# Patient Record
Sex: Male | Born: 2000 | Race: White | Hispanic: No | Marital: Single | Smoking: Never smoker
Health system: Southern US, Community
[De-identification: ages and names within clinical notes are randomized; demographics above are authoritative.]

## PROBLEM LIST (undated history)

## (undated) DIAGNOSIS — F919 Conduct disorder, unspecified: Secondary | ICD-10-CM

## (undated) DIAGNOSIS — F319 Bipolar disorder, unspecified: Secondary | ICD-10-CM

## (undated) DIAGNOSIS — F840 Autistic disorder: Secondary | ICD-10-CM

---
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
## 2021-02-06 ENCOUNTER — Emergency Department (HOSPITAL_COMMUNITY)
Admission: EM | Admit: 2021-02-06 | Discharge: 2021-03-13 | Disposition: A | Discharge status: Home / Self Care | Payer: Medicaid Other | Attending: Emergency Medicine | Admitting: Emergency Medicine

## 2021-02-06 ENCOUNTER — Other Ambulatory Visit: Payer: Self-pay

## 2021-02-06 DIAGNOSIS — F329 Major depressive disorder, single episode, unspecified: Secondary | ICD-10-CM | POA: Insufficient documentation

## 2021-02-06 DIAGNOSIS — F919 Conduct disorder, unspecified: Secondary | ICD-10-CM | POA: Diagnosis present

## 2021-02-06 DIAGNOSIS — R45851 Suicidal ideations: Secondary | ICD-10-CM

## 2021-02-06 DIAGNOSIS — F840 Autistic disorder: Secondary | ICD-10-CM | POA: Diagnosis not present

## 2021-02-06 DIAGNOSIS — R000 Tachycardia, unspecified: Secondary | ICD-10-CM | POA: Diagnosis not present

## 2021-02-06 DIAGNOSIS — F319 Bipolar disorder, unspecified: Secondary | ICD-10-CM | POA: Diagnosis present

## 2021-02-06 DIAGNOSIS — F3481 Disruptive mood dysregulation disorder: Secondary | ICD-10-CM

## 2021-02-06 DIAGNOSIS — Z20822 Contact with and (suspected) exposure to covid-19: Secondary | ICD-10-CM | POA: Diagnosis not present

## 2021-02-06 DIAGNOSIS — R4689 Other symptoms and signs involving appearance and behavior: Secondary | ICD-10-CM | POA: Diagnosis not present

## 2021-02-06 DIAGNOSIS — F918 Other conduct disorders: Secondary | ICD-10-CM | POA: Diagnosis present

## 2021-02-06 LAB — RESP PANEL BY RT-PCR (RSV, FLU A&B, COVID)  RVPGX2
Influenza A by PCR: NEGATIVE
Influenza B by PCR: NEGATIVE
Resp Syncytial Virus by PCR: NEGATIVE
SARS Coronavirus 2 by RT PCR: NEGATIVE

## 2021-02-06 LAB — SALICYLATE LEVEL: Salicylate Lvl: <7 mg/dL — ABNORMAL LOW (ref 7.0–30.0)

## 2021-02-06 LAB — CBC
HCT: 47.2 % (ref 39.0–52.0)
Hemoglobin: 16.6 g/dL (ref 13.0–17.0)
MCH: 28.4 pg (ref 26.0–34.0)
MCHC: 35.2 g/dL (ref 30.0–36.0)
MCV: 80.8 fL (ref 80.0–100.0)
Platelets: 185 10*3/uL (ref 150–400)
RBC: 5.84 MIL/uL — ABNORMAL HIGH (ref 4.22–5.81)
RDW: 13.4 % (ref 11.5–15.5)
WBC: 6.2 10*3/uL (ref 4.0–10.5)
nRBC: 0 % (ref 0.0–0.2)

## 2021-02-06 LAB — RAPID URINE DRUG SCREEN, HOSP PERFORMED
Amphetamines: NOT DETECTED
Barbiturates: NOT DETECTED
Benzodiazepines: NOT DETECTED
Cocaine: NOT DETECTED
Opiates: NOT DETECTED
Tetrahydrocannabinol: NOT DETECTED

## 2021-02-06 LAB — COMPREHENSIVE METABOLIC PANEL
ALT: 24 U/L (ref 0–44)
AST: 19 U/L (ref 15–41)
Albumin: 4.2 g/dL (ref 3.5–5.0)
Alkaline Phosphatase: 84 U/L (ref 38–126)
Anion gap: 9 (ref 5–15)
BUN: 16 mg/dL (ref 6–20)
CO2: 25 mmol/L (ref 22–32)
Calcium: 9.7 mg/dL (ref 8.9–10.3)
Chloride: 103 mmol/L (ref 98–111)
Creatinine, Ser: 1.19 mg/dL (ref 0.61–1.24)
GFR, Estimated: >60 mL/min (ref >60)
Glucose, Bld: 181 mg/dL — ABNORMAL HIGH (ref 70–99)
Potassium: 3.6 mmol/L (ref 3.5–5.1)
Sodium: 137 mmol/L (ref 135–145)
Total Bilirubin: 1.4 mg/dL — ABNORMAL HIGH (ref 0.3–1.2)
Total Protein: 7.4 g/dL (ref 6.5–8.1)

## 2021-02-06 LAB — ACETAMINOPHEN LEVEL: Acetaminophen (Tylenol), Serum: <10 ug/mL — ABNORMAL LOW (ref 10–30)

## 2021-02-06 LAB — ETHANOL: Alcohol, Ethyl (B): <10 mg/dL (ref <10)

## 2021-02-06 MED ORDER — ACETAMINOPHEN 325 MG PO TABS
650.0000 mg | ORAL_TABLET | ORAL | Status: DC | PRN
  Administered 2021-02-10 – 2021-03-01 (×5): 650 mg via ORAL
  Filled 2021-02-06 (×5): qty 2

## 2021-02-06 MED ORDER — ONDANSETRON HCL 4 MG PO TABS
4.0000 mg | ORAL_TABLET | Freq: Three times a day (TID) | ORAL | Status: DC | PRN
  Administered 2021-02-11: 4 mg via ORAL
  Filled 2021-02-06: qty 1

## 2021-02-06 NOTE — ED Notes (Signed)
Patient in bed 31 at this time. Following directions and answering questions app. States that he is suicidal. Wants to kill himself. States his trigger is owner of group home because he "feels like he can tell me what to do all the time". When informed about increased HR, patient states "I think it was just because I was stressed", denies symptoms or feeling heart racing.

## 2021-02-06 NOTE — ED Triage Notes (Addendum)
Pt to triage from group home with [facility]. Pt states he started having suicidal thoughts today, pt states he had a plan to use glass to hurt himself. Pt states he has access to glass in his room at group home.   Group home contact, Loura Halt, [place], [phone], [phone]  [facility] caseworker, Pietro Cassis, [phone]  Lyda Kalata, [phone]

## 2021-02-06 NOTE — ED Provider Notes (Signed)
[facility] EMERGENCY DEPARTMENT Provider Note   CSN: [id number] Arrival date & time: 02/06/21  2033     History Chief Complaint  Patient presents with  . Suicidal    Fount Bahe is a 20 y.o. male w PMHx austism, bipolar d/o, conduct d/o, brought in by [facility] from group home for SI.  Patient states he lives at a group home and does quite well with the staff, however the owner of the group home tends to get on his nerves.  He states he is not sure why she does not start treating him like an adult as he is now 20 years old.  He states she told him today that he needed supervision and this made him angry.  He states he tries to do well by managing his anger though finds it difficult when she frustrates him.  He thought that if he committed suicide in her house then maybe somebody would take notice.  He intended to break glass and use that to harm himself and end his life.  He reports 5 previous suicide attempts in the past of various methods including walking in front of a car, trying to hang himself, using knives.  Denies any current drug or alcohol use.  Denies HI or hallucinations.  He is voluntary currently, has no desire to return back to the group home tonight.  Of note, patient was noted to be tachycardic in triage.  He states he was very angry and worked up, he has since calmed down.  The history is provided by the patient.       Past Medical History:  Diagnosis Date  . Autism   . Bipolar 1 disorder (HCC)   . Conduct disorder     Patient Active Problem List   Diagnosis Date Noted  . Autism   . Bipolar 1 disorder (HCC)   . 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     No family history on file.  Social History   Tobacco Use  . Smoking status: Never Smoker  . Smokeless tobacco: Never Used  Substance Use Topics  . Alcohol use: Never  . Drug use: Never    Home Medications Prior to  Admission medications   Medication Sig Start Date End Date Taking? Authorizing Provider  divalproex (DEPAKOTE ER) 250 MG 24 hr tablet Take 250 mg by mouth daily.  09/21/19   [provider]  divalproex (DEPAKOTE ER) 500 MG 24 hr tablet Take 1,000 mg by mouth at bedtime.  09/21/19   [provider]  guanFACINE (TENEX) 2 MG tablet Take 2 mg by mouth daily.  08/26/19   [provider]  hydrochlorothiazide (HYDRODIURIL) 12.5 MG tablet Take 12.5 mg by mouth daily. 07/28/20   [provider]  QUEtiapine (SEROQUEL) 200 MG tablet Take 200 mg by mouth 2 (two) times daily. 09/17/19   [provider]  rosuvastatin (CRESTOR) 20 MG tablet Take 20 mg by mouth daily. 08/24/20   [provider]    Allergies    Patient has no known allergies.  Review of Systems   Review of Systems  Psychiatric/Behavioral: Positive for suicidal ideas.  All other systems reviewed and are negative.   Physical Exam Updated Vital Signs BP 107/72   Pulse 94   Temp 98.3 F (36.8 C) (Oral)   Resp 13   SpO2 98%   Physical Exam Vitals and nursing note reviewed.  Constitutional:  General: He is not in acute distress.    Appearance: He is well-developed.  HENT:     Head: Normocephalic and atraumatic.  Eyes:     Conjunctiva/sclera: Conjunctivae normal.  Cardiovascular:     Rate and Rhythm: Normal rate and regular rhythm.  Pulmonary:     Effort: Pulmonary effort is normal. No respiratory distress.     Breath sounds: Normal breath sounds.  Abdominal:     Palpations: Abdomen is soft.  Skin:    General: Skin is warm.  Neurological:     Mental Status: He is alert.  Psychiatric:        Attention and Perception: Attention normal.        Mood and Affect: Mood is depressed. Affect is flat.        Speech: Speech normal.        Behavior: Behavior normal. Behavior is cooperative.        Thought Content: Thought content includes suicidal ideation. Thought content  includes suicidal plan.     ED Results / Procedures / Treatments   Labs (all labs ordered are listed, but only abnormal results are displayed) Labs Reviewed  COMPREHENSIVE METABOLIC PANEL - Abnormal; Notable for the following components:      Result Value   Glucose, Bld 181 (*)    Total Bilirubin 1.4 (*)    All other components within normal limits  SALICYLATE LEVEL - Abnormal; Notable for the following components:   Salicylate Lvl <7.0 (*)    All other components within normal limits  ACETAMINOPHEN LEVEL - Abnormal; Notable for the following components:   Acetaminophen (Tylenol), Serum <10 (*)    All other components within normal limits  CBC - Abnormal; Notable for the following components:   RBC 5.84 (*)    All other components within normal limits  RESP PANEL BY RT-PCR (RSV, FLU A&B, COVID)  [id number]  ETHANOL  RAPID URINE DRUG SCREEN, HOSP PERFORMED    EKG None  Radiology No results found.  Procedures Procedures   Medications Ordered in ED Medications  acetaminophen (TYLENOL) tablet 650 mg (has no administration in time range)  ondansetron (ZOFRAN) tablet 4 mg (has no administration in time range)    ED Course  I have reviewed the triage vital signs and the nursing notes.  Pertinent labs & imaging results that were available during my care of the patient were reviewed by me and considered in my medical decision making (see chart for details).    MDM Rules/Calculators/A&P                          Patient is an 20 year old male with history of autism, bipolar 1 disorder, conduct disorder, presenting voluntarily from a group home via [facility] for suicidal ideation.  He reports plans to break glass and use the glass to harm himself and end his life.  Reports previous attempts in the past.  Denies any drug or alcohol use.  Initially was quite tachycardic on arrival, appeared to be sinus tach on EKG, however on evaluation he states he is calm down, and heart rate has  normalized.  Screening labs are unremarkable, with exception of hyperglycemia of 181.  Patient is medically cleared for TTS evaluation.  Final Clinical Impression(s) / ED Diagnoses Final diagnoses:  None    Rx / DC Orders ED Discharge Orders    None       Addy Mcmannis, [place] N, PA-C 02/06/21 2249    Bernette Mayers,  Bonnita Levan, MD 02/06/21 [phone]

## 2021-02-07 ENCOUNTER — Encounter (HOSPITAL_COMMUNITY): Payer: Self-pay | Admitting: Emergency Medicine

## 2021-02-07 DIAGNOSIS — R4689 Other symptoms and signs involving appearance and behavior: Secondary | ICD-10-CM | POA: Insufficient documentation

## 2021-02-07 DIAGNOSIS — F918 Other conduct disorders: Secondary | ICD-10-CM | POA: Diagnosis not present

## 2021-02-07 DIAGNOSIS — F840 Autistic disorder: Secondary | ICD-10-CM | POA: Diagnosis not present

## 2021-02-07 DIAGNOSIS — R45851 Suicidal ideations: Secondary | ICD-10-CM

## 2021-02-07 DIAGNOSIS — F319 Bipolar disorder, unspecified: Secondary | ICD-10-CM | POA: Diagnosis not present

## 2021-02-07 DIAGNOSIS — F3481 Disruptive mood dysregulation disorder: Secondary | ICD-10-CM | POA: Insufficient documentation

## 2021-02-07 MED ORDER — QUETIAPINE FUMARATE 200 MG PO TABS
200.0000 mg | ORAL_TABLET | Freq: Two times a day (BID) | ORAL | Status: DC
  Administered 2021-02-07 – 2021-03-13 (×68): 200 mg via ORAL
  Filled 2021-02-07 (×69): qty 1

## 2021-02-07 NOTE — ED Provider Notes (Signed)
Emergency Medicine Observation Re-evaluation Note  Brian Cardenas is a 20 y.o. male, seen on rounds today.  Pt initially presented to the ED for complaints of Suicidal Currently, the patient is awaiting AM re-evaluation by TTS.  Physical Exam  BP 132/80   Pulse 79   Temp 98.5 F (36.9 C) (Oral)   Resp 20   SpO2 96%  Physical Exam General: Calm and cooperative.  Cardiac: Well perfused.  Lungs: Even, unlabored respirations.  Psych: Calm  ED Course / MDM  EKG:EKG Interpretation  Date/Time:  Tuesday February 06 2021 20:37:38 EDT Ventricular Rate:  150 PR Interval:  134 QRS Duration: 88 QT Interval:  262 QTC Calculation: 413 R Axis:   119 Text Interpretation: Sinus tachycardia Right axis deviation Abnormal ECG No old tracing to compare Confirmed by [name] ([id number]) on 02/07/2021 2:36:25 AM   I have reviewed the labs performed to date as well as medications administered while in observation.  Recent changes in the last 24 hours include TTS assessment with recommendation for AM re-evaluation.   Patient re-evaluated by TTS. Stable for discharge back to facility.   Plan  Current plan is for AM re-evaluation.   Maia Plan, MD 02/07/21 1245

## 2021-02-07 NOTE — ED Provider Notes (Signed)
This AM pt reports feeling much improved. Is reading book intently, happy, smiling.   Pt with frustrated/agitated behavior yesterday, but now calm, cooperative. Pt denies any thoughts of harm to self or others. No SI.  Does not appear to be responding to internal stimuli. Normal mood/affect. No acute psychosis.   It appears pt with history intermittent agitated or frustrated behaviors, possibly related to incidents/times when things dont go his way, and/or related to rules at group home.   Pt currently appears medically and psychiatrically at baseline and clear for return to his group home.    Rec outpt pcp and bh f/u.     Cathren Laine, MD 02/07/21 1153

## 2021-02-07 NOTE — Consult Note (Signed)
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 male patient admitted when presented with complaints of suicidal ideation.  HPI:  Brian Cardenas, 20 y.o., male patient seen via tele health by this provider, consulted with Dr. Nelly Rout; and chart reviewed on 02/07/21.  On evaluation Brian Cardenas reports he got angry with the owner of the group home because she want to treat him like a child.  States he can't go where he wants or do things that he wants without supervision like a child.  States he is calm at this time and denies suicidal/self-harm/homicidal ideation.   During evaluation Brian Cardenas is sitting on side of bed in no acute distress.  He is alert, oriented x 4, calm and cooperative.  His mood is euthymic with congruent affect.  He does not appear to be responding to internal/external stimuli or delusional thoughts.  Patient denies suicidal/self-harm/homicidal ideation, psychosis, and paranoia.  Patient answered question appropriately.  Past Psychiatric History: See above  Risk to Self:  No Risk to Others:  No Prior Inpatient Therapy:  Yes Prior Outpatient Therapy:  Yes  Past Medical History:  Past Medical History:  Diagnosis Date  . Autism   . Bipolar 1 disorder (HCC)   . Conduct disorder    History reviewed. No pertinent surgical history. Family History: History reviewed. No pertinent family history. Family Psychiatric  History: None reported Social History:  Social History    Substance and Sexual Activity  Alcohol Use Never     Social History   Substance and Sexual Activity  Drug Use Never    Social History   Socioeconomic History  . Marital status: Single    Spouse name: Not on file  . Number of children: Not on file  . Years of education: Not on file  . Highest education level: Not on file  Occupational History  . Not on file  Tobacco Use  . Smoking status: Never Smoker  . Smokeless tobacco: Never Used  Substance and Sexual Activity  . Alcohol use: Never  . Drug use: Never  . Sexual activity: Not on file  Other Topics Concern  . Not on file  Social History Narrative  . Not on file   Social Determinants of Health   Financial Resource Strain: Not on file  Food Insecurity: Not on file  Transportation Needs: Not on file  Physical Activity: Not on file  Stress: Not on file  Social Connections: Not on file   Additional Social History:    Allergies:  No Known Allergies  Labs:  Results for orders placed or performed during the hospital encounter of 02/06/21 (from the past 48 hour(s))  Comprehensive metabolic panel     Status: Abnormal   Collection Time: 02/06/21  8:55 PM  Result Value Ref Range   Sodium 137 135 - 145 mmol/L   Potassium 3.6 3.5 - 5.1 mmol/L   Chloride 103 98 - 111 mmol/L   CO2 25 22 - 32 mmol/L   Glucose, Bld 181 (H) 70 - 99  mg/dL    Comment: Glucose reference range applies only to samples taken after fasting for at least 8 hours.   BUN 16 6 - 20 mg/dL   Creatinine, Ser 3.71 0.61 - 1.24 mg/dL   Calcium 9.7 8.9 - 69.6 mg/dL   Total Protein 7.4 6.5 - 8.1 g/dL   Albumin 4.2 3.5 - 5.0 g/dL   AST 19 15 - 41 U/L   ALT 24 0 - 44 U/L   Alkaline Phosphatase 84 38 - 126 U/L   Total Bilirubin 1.4 (H) 0.3 - 1.2 mg/dL   GFR, Estimated >78 >93 mL/min    Comment: (NOTE) Calculated using the CKD-EPI Creatinine Equation (2021)    Anion gap 9 5 - 15    Comment: Performed at [facility], [address]  [place], [place] [address]  Ethanol     Status: None   Collection Time: 02/06/21  8:55 PM  Result Value Ref Range   Alcohol, Ethyl (B) <10 <10 mg/dL    Comment: (NOTE) Lowest detectable limit for serum alcohol is 10 mg/dL.  For medical purposes only. Performed at [facility], [address], [place], [place] [address]   Salicylate level     Status: Abnormal   Collection Time: 02/06/21  8:55 PM  Result Value Ref Range   Salicylate Lvl <7.0 (L) 7.0 - 30.0 mg/dL    Comment: Performed at [facility], [address] [place], [place] [address]  Acetaminophen level     Status: Abnormal   Collection Time: 02/06/21  8:55 PM  Result Value Ref Range   Acetaminophen (Tylenol), Serum <10 (L) 10 - 30 ug/mL    Comment: (NOTE) Therapeutic concentrations vary significantly. A range of 10-30 ug/mL  may be an effective concentration for many patients. However, some  are best treated at concentrations outside of this range. Acetaminophen concentrations >150 ug/mL at 4 hours after ingestion  and >50 ug/mL at 12 hours after ingestion are often associated with  toxic reactions.  Performed at [facility], [address] [place], [place] [address]   cbc     Status: Abnormal   Collection Time: 02/06/21  8:55 PM  Result Value Ref Range   WBC 6.2 4.0 - 10.5 K/uL   RBC 5.84 (H) 4.22 - 5.81 MIL/uL   Hemoglobin 16.6 13.0 - 17.0 g/dL   HCT 53.6 14.4 - 31.5 %   MCV 80.8 80.0 - 100.0 fL   MCH 28.4 26.0 - 34.0 pg   MCHC 35.2 30.0 - 36.0 g/dL   RDW 40.0 86.7 - 61.9 %   Platelets 185 150 - 400 K/uL   nRBC 0.0 0.0 - 0.2 %    Comment: Performed at [facility], [address] [place], [place] [address]  Rapid urine drug screen (hospital performed)     Status: None   Collection Time: 02/06/21  8:56 PM  Result Value Ref Range   Opiates NONE DETECTED NONE DETECTED   Cocaine NONE DETECTED NONE DETECTED   Benzodiazepines NONE DETECTED NONE DETECTED   Amphetamines NONE DETECTED NONE  DETECTED   Tetrahydrocannabinol NONE DETECTED NONE DETECTED   Barbiturates NONE DETECTED NONE DETECTED    Comment: (NOTE) DRUG SCREEN FOR MEDICAL PURPOSES ONLY.  IF CONFIRMATION IS NEEDED FOR ANY PURPOSE, NOTIFY LAB WITHIN 5 DAYS.  LOWEST DETECTABLE LIMITS FOR URINE DRUG SCREEN Drug Class  Cutoff (ng/mL) Amphetamine and metabolites    1000 Barbiturate and metabolites    200 Benzodiazepine                 200 Tricyclics and metabolites     300 Opiates and metabolites        300 Cocaine and metabolites        300 THC                            50 Performed at [facility], [address] [place], [place] [id number]   Resp panel by RT-PCR (RSV, Flu A&B, Covid) Nasopharyngeal Swab     Status: None   Collection Time: 02/06/21 11:10 PM   Specimen: Nasopharyngeal Swab; Nasopharyngeal(NP) swabs in vial transport medium  Result Value Ref Range   SARS Coronavirus 2 by RT PCR NEGATIVE NEGATIVE    Comment: (NOTE) SARS-CoV-2 target nucleic acids are NOT DETECTED.  The SARS-CoV-2 RNA is generally detectable in upper respiratory specimens during the acute phase of infection. The lowest concentration of SARS-CoV-2 viral copies this assay can detect is 138 copies/mL. A negative result does not preclude SARS-Cov-2 infection and should not be used as the sole basis for treatment or other patient management decisions. A negative result may occur with  improper specimen collection/handling, submission of specimen other than nasopharyngeal swab, presence of viral mutation(s) within the areas targeted by this assay, and inadequate number of viral copies(<138 copies/mL). A negative result must be combined with clinical observations, patient history, and epidemiological information. The expected result is Negative.  Fact Sheet for Patients:  BloggerCourse.comhttps://www.fda.gov/media/152166/download  Fact Sheet for Healthcare Providers:  SeriousBroker.ithttps://www.fda.gov/media/152162/download  This  test is no t yet approved or cleared by the [place] FDA and  has been authorized for detection and/or diagnosis of SARS-CoV-2 by FDA under an Emergency Use Authorization (EUA). This EUA will remain  in effect (meaning this test can be used) for the duration of the COVID-19 declaration under Section 564(b)(1) of the Act, 21 U.S.C.section 360bbb-3(b)(1), unless the authorization is terminated  or revoked sooner.       Influenza A by PCR NEGATIVE NEGATIVE   Influenza B by PCR NEGATIVE NEGATIVE    Comment: (NOTE) The Xpert Xpress SARS-CoV-2/FLU/RSV plus assay is intended as an aid in the diagnosis of influenza from Nasopharyngeal swab specimens and should not be used as a sole basis for treatment. Nasal washings and aspirates are unacceptable for Xpert Xpress SARS-CoV-2/FLU/RSV testing.  Fact Sheet for Patients: BloggerCourse.comhttps://www.fda.gov/media/152166/download  Fact Sheet for Healthcare Providers: SeriousBroker.ithttps://www.fda.gov/media/152162/download  This test is not yet approved or cleared by the [place] FDA and has been authorized for detection and/or diagnosis of SARS-CoV-2 by FDA under an Emergency Use Authorization (EUA). This EUA will remain in effect (meaning this test can be used) for the duration of the COVID-19 declaration under Section 564(b)(1) of the Act, 21 U.S.C. section 360bbb-3(b)(1), unless the authorization is terminated or revoked.     Resp Syncytial Virus by PCR NEGATIVE NEGATIVE    Comment: (NOTE) Fact Sheet for Patients: BloggerCourse.comhttps://www.fda.gov/media/152166/download  Fact Sheet for Healthcare Providers: SeriousBroker.ithttps://www.fda.gov/media/152162/download  This test is not yet approved or cleared by the [place] FDA and has been authorized for detection and/or diagnosis of SARS-CoV-2 by FDA under an Emergency Use Authorization (EUA). This EUA will remain in effect (meaning this test can be used) for the duration of the COVID-19 declaration under Section 564(b)(1) of  the Act,  21 U.S.C. section 360bbb-3(b)(1), unless the authorization is terminated or revoked.  Performed at [facility], [address] [place], [place] [address]     Medications:  Current Facility-Administered Medications  Medication Dose Route Frequency Provider Last Rate Last Admin  . acetaminophen (TYLENOL) tablet 650 mg  650 mg Oral Q4H PRN Robinson, [place] N, PA-C      . ondansetron [facility]) tablet 4 mg  4 mg Oral Q8H PRN Robinson, [place] N, PA-C       Current Outpatient Medications  Medication Sig Dispense Refill  . divalproex (DEPAKOTE ER) 250 MG 24 hr tablet Take 250 mg by mouth daily.     . divalproex (DEPAKOTE ER) 500 MG 24 hr tablet Take 1,000 mg by mouth at bedtime.     [name] guanFACINE (TENEX) 2 MG tablet Take 2 mg by mouth daily.     . hydrochlorothiazide (HYDRODIURIL) 12.5 MG tablet Take 12.5 mg by mouth daily.    . QUEtiapine (SEROQUEL) 200 MG tablet Take 200 mg by mouth 2 (two) times daily.    . rosuvastatin (CRESTOR) 20 MG tablet Take 20 mg by mouth daily.      Musculoskeletal: Strength & Muscle Tone: within normal limits Gait & Station: normal Patient leans: N/A  Psychiatric Specialty Exam: Physical Exam Vitals and nursing note reviewed. Chaperone present: Sitter at bedside.  Constitutional:      General: He is not in acute distress.    Appearance: Normal appearance. He is not ill-appearing.  Cardiovascular:     Rate and Rhythm: Normal rate.  Pulmonary:     Effort: Pulmonary effort is normal.  Musculoskeletal:        General: Normal range of motion.     Cervical back: Normal range of motion.  Neurological:     Mental Status: He is alert and oriented to person, place, and time.  Psychiatric:        Attention and Perception: Attention and perception normal. He does not perceive auditory or visual hallucinations.        Mood and Affect: Mood and affect normal.        Speech: Speech normal.        Behavior: Behavior normal. Behavior is cooperative.         Thought Content: Thought content normal.        Cognition and Memory: Cognition and memory normal.        Judgment: Judgment is impulsive.     Review of Systems  Constitutional: Negative.   HENT: Negative.   Eyes: Negative.   Respiratory: Negative.   Cardiovascular: Negative.   Gastrointestinal: Negative.   Genitourinary: Negative.   Musculoskeletal: Negative.   Skin: Negative.   Neurological: Negative.   Hematological: Negative.   Psychiatric/Behavioral: Negative for confusion, self-injury and sleep disturbance. Agitation: Denies at this time but states he is easily tic off when he is treated like a child. Suicidal ideas: Denies. The patient is not nervous/anxious and is not hyperactive.     Blood pressure 103/76, pulse 76, temperature 98 F (36.7 C), temperature source Oral, resp. rate 20, SpO2 98 %.There is no height or weight on file to calculate BMI.  General Appearance: Casual  Eye Contact:  Good  Speech:  Clear and Coherent and Normal Rate  Volume:  Normal  Mood:  Euthymic  Affect:  Appropriate and Congruent  Thought Process:  Coherent, Goal Directed and Linear  Orientation:  Full (Time, Place, and Person)  Thought Content:  WDL  Suicidal  Thoughts:  No  Homicidal Thoughts:  No  Memory:  Immediate;   Good Recent;   Good  Judgement:  Intact  Insight:  Present  Psychomotor Activity:  Normal  Concentration:  Concentration: Good and Attention Span: Good  Recall:  Good  Fund of Knowledge:  Fair  Language:  Good  Akathisia:  No  Handed:  Right  AIMS (if indicated):     Assets:  Communication Skills Desire for Improvement Housing Physical Health Resilience Social Support  ADL's:  Intact  Cognition:  WNL  Sleep:        Treatment Plan Summary: Plan Psychiatrically clear.  Patient to follow up with current outpatient psychiatric provider.  Return to group home  Disposition: No evidence of imminent risk to self or others at present.   Patient does not  meet criteria for psychiatric inpatient admission. Supportive therapy provided about ongoing stressors. Discussed crisis plan, support from social network, calling 911, coming to the Emergency Department, and calling Suicide Hotline.  This service was provided via telemedicine using a 2-way, interactive audio and video technology.  Names of all persons participating in this telemedicine service and their role in this encounter. Name: Assunta Found Role: NP  Name: Dr. Nelly Rout Role: Psychiatrist  Name: Joana Reamer Role: Patient  Name:  Role:     Assunta Found, NP 02/07/2021 1:31 PM

## 2021-02-07 NOTE — Discharge Instructions (Addendum)
It was our pleasure to provide your ER care today - we hope that you feel better.  Please facilitate transport for patient back to his group home.   Follow up with your primary care and behavioral health provider in the next 1-2 weeks.   For mental health issues and/or crisis, you may go directly to the Behavioral Health Urgent Care - they are open 24/7 and walk-ins are welcome.   Return to ER if worse, new symptoms, fevers, trouble breathing, new or severe pain, or other concern.
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
## 2021-02-07 NOTE — Progress Notes (Signed)
..   Transition of Care [facility]) - Emergency Department Mini Assessment   Patient Details  Name: Brian Cardenas MRN: [id number] Date of Birth: 2001-03-02  Transition of Care [facility]) CM/SW Contact:    Lossie Faes Tarpley-Carter, LCSWA Phone Number: 02/07/2021, 2:31 PM   Clinical Narrative: TOC CSW attempted to contact Adella Hare, [phone] and [phone]. CSW left HIPPA compliant message with my contact information.  Shawndrea Rutkowski Tarpley-Carter, MSW, LCSW-A Pronouns:  She, Her, Hers                  [facility] ED Transitions of CareClinical Social Worker [email or web address] [phone]   ED Mini Assessment: What brought you to the Emergency Department? : Suicidal Ideations  Barriers to Discharge: No Barriers Identified     Means of departure: Police  Interventions which prevented an admission or readmission: Homeless Screening    Patient Contact and Communications Key Contact 1: Conard Novak Date: 02/07/21,     Contact Phone Number: [phone] and [phone] Call outcome: CSW left HIPPA compliant message with contact information.  Patient states their goals for this hospitalization and ongoing recovery are:: Pt wants to return to group home.   Choice offered to / list presented to : NA  Admission diagnosis:  z04.6 Patient Active Problem List   Diagnosis Date Noted  . Suicidal ideation 02/07/2021  . Behavior symptom   . Disruptive mood dysregulation disorder (HCC)   . Autism   . Bipolar 1 disorder (HCC)   . 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

## 2021-02-07 NOTE — BH Assessment (Addendum)
  Disposition Nira Conn, NP, recommends overnight observation for safety and stabilization with psych reassessment in the AM. Patient will remain at [facility], not appropriate due to PMHx. Everardo Pacific, RN, informed via secure chat.

## 2021-02-07 NOTE — ED Notes (Signed)
TTS in progress 

## 2021-02-07 NOTE — ED Notes (Signed)
Pt can not be DC back to Group home . Pt is a ward of the state DSS called this writer to report Pt could not be DC'd back to the current Group Home

## 2021-02-07 NOTE — ED Notes (Signed)
Director Jerline Pain. Informed that  Pt's Guardian is the state and DSS called to report Pt can not return to Group Home at this time

## 2021-02-07 NOTE — ED Notes (Signed)
Pt up and down continuously out of bed talking to sitter at bedside. Pt noted to be touching sitters head, pt asked to stop and step back from sitter, pt follow command without issue, pt appears non violent and calm at this time. This RN spoke with Recruitment consultant, sitter reports feeling safe with pt. This RN spoke with Dalene Seltzer, MD in regards to home medications for pt, MD agreed to order a home med, home medications pending review from pharmacy at this time.

## 2021-02-07 NOTE — ED Notes (Signed)
TC from DSS about Pt group home. Owner Fara Boros will not take patient back due to the incident  that at Group home last night.

## 2021-02-08 MED ORDER — DIVALPROEX SODIUM 250 MG PO DR TAB: 750.0000 mg | DELAYED_RELEASE_TABLET | Freq: Two times a day (BID) | ORAL | Status: DC

## 2021-02-08 MED ORDER — DIVALPROEX SODIUM 250 MG PO DR TAB
750.0000 mg | DELAYED_RELEASE_TABLET | Freq: Two times a day (BID) | ORAL | Status: DC
  Administered 2021-02-08 – 2021-03-13 (×66): 750 mg via ORAL
  Filled 2021-02-08 (×65): qty 3

## 2021-02-08 MED ORDER — DIVALPROEX SODIUM 250 MG PO DR TAB
1000.0000 mg | DELAYED_RELEASE_TABLET | Freq: Every day | ORAL | Status: DC
  Administered 2021-02-08 – 2021-03-12 (×32): 1000 mg via ORAL
  Filled 2021-02-08 (×33): qty 4

## 2021-02-08 MED ORDER — HYDROCHLOROTHIAZIDE 25 MG PO TABS
12.5000 mg | ORAL_TABLET | Freq: Every day | ORAL | Status: DC
  Administered 2021-02-08 – 2021-03-13 (×33): 12.5 mg via ORAL
  Filled 2021-02-08 (×33): qty 1

## 2021-02-08 MED ORDER — GUANFACINE HCL 1 MG PO TABS
2.0000 mg | ORAL_TABLET | Freq: Every day | ORAL | Status: DC
  Administered 2021-02-08 – 2021-03-13 (×31): 2 mg via ORAL
  Filled 2021-02-08 (×45): qty 2

## 2021-02-08 MED ORDER — DIVALPROEX SODIUM 250 MG PO DR TAB: 1000.0000 mg | DELAYED_RELEASE_TABLET | Freq: Every day | ORAL | Status: DC

## 2021-02-08 MED ORDER — DIVALPROEX SODIUM ER 250 MG PO TB24: 250.0000 mg | ORAL_TABLET | Freq: Every day | ORAL | Status: DC

## 2021-02-08 MED ORDER — ROSUVASTATIN CALCIUM 5 MG PO TABS
20.0000 mg | ORAL_TABLET | Freq: Every day | ORAL | Status: DC
  Administered 2021-02-08 – 2021-03-13 (×34): 20 mg via ORAL
  Filled 2021-02-08 (×34): qty 4

## 2021-02-08 MED ORDER — DIVALPROEX SODIUM ER 500 MG PO TB24: 1000.0000 mg | ORAL_TABLET | Freq: Every day | ORAL | Status: DC

## 2021-02-08 NOTE — Progress Notes (Signed)
CSW spoke with Brian Cardenas from [place] who stated she was not aware of all the details of patient. CSW informed care coordinator of what has been going on in the ED and if she would also be looking for placement. Brian Cardenas told CSW that she will not be looking for any placement until DSS tells her too. Brian Cardenas then went on to say that DSS has not shared any information with her. CSW was also put on hold while Brian Cardenas made her [employer] order. CSW stated she wanted to see about placement to have a back-up plan if the crisis respite falls through. Brian Cardenas told CSW that DSS will make that call and not CSW. Brian Cardenas told CSW that if DSS wanted her to look for placement then they would call her. Brian Cardenas told CSW that she doesn't have any disclosures to even talk with CSW about patient and cannot say anything else further. CSW told Brian Cardenas she can call DSS and let them know to call her to discuss placement. Brian Cardenas then told CSW that she will not be at work until Monday. CSW again stated she would let DSS know. Brian Cardenas told CSW that she will call DSS also because she spoke with CSW. CSW stated okay and Brian Cardenas ended the call.

## 2021-02-08 NOTE — Progress Notes (Signed)
CSW received a call from Ms. Turner, legal guardian at DSS. Ms. Mayford Knife stated she has been on the phone with patients crisis team all morning and they discussed patient going to a crisis respite but unsure if there is an availability. Ms. Mayford Knife stated they are requesting patient be held in the ED until that's completed. CSW asked Ms. Turner what is the plan if they cannot find placement. Ms. Mayford Knife stated they will continue to find other options. CSW told Ms. Turner patient is psych and medically cleared for discharge and has no reason to be in the ED further. CSW stated patient cannot stay in the ED and that the ED is not a placement for patient. Ms. Mayford Knife stated she understood and does not anticipate that this will take weeks and months to find placement. CSW informed Ms. Turner that a meeting with management might take place if placement has not been found for patient and if they have no plan on where he can go. Ms. Mayford Knife stated she would contact CSW back and let her know about the crisis respite.

## 2021-02-08 NOTE — Progress Notes (Signed)
CSW contacted [facility] and left a message with [place], patients care coordinator. CSW also attempted to leave a vm on Kandy's cell but mailbox was full and went straight to vm [phone]

## 2021-02-08 NOTE — Progress Notes (Signed)
CSW contacted Brian Cardenas from [facility] to find out if she contacted the group home yet. Brian Cardenas told CSW the lead coordinator is trying to plan an emergency meeting with the group home and DSS. Brian Cardenas stated she is gong to check to see if that meeting was coordinated and will give CSW a call back.

## 2021-02-08 NOTE — ED Provider Notes (Signed)
Emergency Medicine Observation Re-evaluation Note  Brian Cardenas is a 20 y.o. male, seen on rounds today.  Pt initially presented to the ED for complaints of Agitation and Mental Health Problem Currently, the patient is awaiting possible transfer back to his facility.  Physical Exam  BP 123/85 (BP Location: Right Arm)   Pulse 86   Temp 99.1 F (37.3 C) (Oral)   Resp 15   SpO2 95%  Physical Exam General: resting comfortably, NAD Lungs: normal WOB Psych: currently calm and resting  ED Course / MDM  EKG:EKG Interpretation  Date/Time:  Tuesday February 06 2021 20:37:38 EDT Ventricular Rate:  150 PR Interval:  134 QRS Duration: 88 QT Interval:  262 QTC Calculation: 413 R Axis:   119 Text Interpretation: Sinus tachycardia Right axis deviation Abnormal ECG No old tracing to compare Confirmed by [name] ([id number]) on 02/07/2021 2:36:25 AM   I have reviewed the labs performed to date as well as medications administered while in observation.  Plan  Current plan is for review of his evaluation with his current facility.  He has been psychiatrically cleared.   Rozelle Logan, DO 02/08/21 [id number]

## 2021-02-08 NOTE — Progress Notes (Signed)
CSW contacted DSS and made them aware of the phone call with [place]. Ms. Mayford Knife stated she was been trying to get in touch with [place] but she has not answered. Ms. Mayford Knife stated she would call [place]. Ms. Mayford Knife also wanted to know if patient had his belongings and the phone he has. Ms. Mayford Knife stated the phone is the main issue with patient and if the hospital has it she will come up there and get it.

## 2021-02-08 NOTE — Progress Notes (Signed)
CSW sent Ms.Turner, legal guardian from DSS an email requesting a call

## 2021-02-08 NOTE — Progress Notes (Signed)
CSW spoke with Cherre Blanc from [facility] start, [phone] about the crisis respite plan. Cherre Blanc stated it looks like her supervisor Shermaine spoke with DSS about that being an option. Cherre Blanc stated it doesn't look like the triage call for placement will happen today and that it should happen tomorrow morning.

## 2021-02-08 NOTE — Progress Notes (Signed)
CSW contacted Brian Cardenas, Legal guardian at DSS. CSW left a message.
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
## 2021-02-08 NOTE — Progress Notes (Signed)
CSW received a call from [place] from [facility]. Cherre Blanc stated she provides crisis interventions services and plans on contacting the group home to see if she can offer any services and get patient back to his group home.

## 2021-02-08 NOTE — Progress Notes (Signed)
CSW received a call from [place] from [place] stating that she spoke with Ms. Turner from DSS who told her the samething CSW told her earlier today. Cline Crock told CSW she wanted to update her with her efforts to place patient. Cline Crock told CSW she plans on contacting the group home to see if they would take patient back until they find new placement. Cline Crock stated if they say no she plans on submitting referrals for other group home placements. Cline Crock told CSW she will be contacting DSS with her updates on placement.

## 2021-02-09 NOTE — Progress Notes (Signed)
CSW contacted [facility] start coordinator and left a message. CSW also called [facility] but was unable to leave a message due to mailbox being full.

## 2021-02-09 NOTE — ED Notes (Signed)
Pt resting in bed, reading. Pt calm and cooperative.

## 2021-02-09 NOTE — ED Notes (Signed)
Pt awake and appropriate. Calm and cooperative at this time.

## 2021-02-09 NOTE — Progress Notes (Signed)
CSW contacted [facility] to confirm what time the meeting would be today. CSW spoke with coordinator Shermaine who told CSW that she has not received a response back from the group home owner or [facility] to have the meeting. CSW was told they have no emergency or back-up placement. CSW was told that she will give CSW a call back.

## 2021-02-09 NOTE — Progress Notes (Signed)
CSW spoke with group home owner, Brian Cardenas. Brian Cardenas stated she was not notified about any meeting today. Brian Cardenas voiced her concerns that APS does not know patients behaviors and they have not even met him and they have been the legal guardian since September of 2021. Brian Cardenas stated she was told by Ms. Turner that she knew what patients behaviors are so what is the problem. Brian Cardenas stated they sent her an email stating that patient could go somewhere for 2-3 weeks if only she guarantees to take patient back. CSW asked the name of the place and was told [facility] has placements and believes its the [place] area. Ms. Thersa Salt went into the phone is a trigger and that no one has been here to get the phone and she has been saying this to [place] since February. Brian Cardenas stated she feels no one is listening to her and talking at her. Brian Cardenas stated his not safe at this time to bring patient back. Brian Cardenas stated she was willing to meet.

## 2021-02-09 NOTE — Progress Notes (Signed)
CSW spoke with [facility]PS who stated that they have been sending off referrals for placement. APS stated they are going to call [facility] to find out what progress has been made. CSW was told [facility] wants discharge paperwork from the hospital for placement. CSW contacted [facility] and stated APS stated their facility is requesting information on placement. CSW was told by [facility] that they have no idea what CSW is talking about and told CSW to have APS contact them. CSW called and sent an email to APS about contacting [facility].

## 2021-02-09 NOTE — ED Notes (Signed)
Pt remains asleep at this time. Respirations even and unlabored.  

## 2021-02-09 NOTE — ED Notes (Signed)
Sitter shift ended and replacement not available.

## 2021-02-09 NOTE — ED Notes (Signed)
Pt continues to be calm and cooperative. Currently resting in bed watching TV. Medication administration completed without difficulty. Will continue to monitor.

## 2021-02-09 NOTE — Progress Notes (Signed)
Per APS guardian rykiell Turner, Pt is to have no contact with his father.

## 2021-02-09 NOTE — ED Notes (Signed)
All of patients belongings given to Barron Schmid

## 2021-02-09 NOTE — Progress Notes (Signed)
CSW spent from 11:21 AM to 12:30 PM on the phone with APS and [facility] about placement for patient. [facility] contacted [facility] who does not accept patients insurance. APS still plans to send them a referral. Shelly Coss also stated they plan to continue to find placement. [facility] and [facility] believe patient needs to be in an inpatient lockdown facility and do not feel he is appropriate for a group home or family care home. APS asked for the hospital to make further evaluations to prevent patient from coming to the hospital numerous times. APS feels patient should have been referred for inpatient treatment. CSW notified provider for today of their concerns.

## 2021-02-09 NOTE — ED Notes (Addendum)
Pt resting comfortably, respirations even and unlabored.

## 2021-02-09 NOTE — Progress Notes (Signed)
CSW received conference call regarding Pt.  [place] of [address] APS, [place] and [place] [facility] [place] were on call.   Care coordinators state that their opinion is that Pt presents a danger to himself and community and they request a meeting with behavioral health team.  CSW informed all that Pt has been cleared by [facility] team and that this CSW can request such a meeting but cannot guarantee that such a meeting will occur.  CSW reiterated that ED is not appropriate place for Pt.  All 3 callers restated that they are seeking new placement for patient due to group home's refusal to accept Pt back.  After call this CSW updated [facility] director Wandra Mannan about case.

## 2021-02-09 NOTE — ED Notes (Signed)
Pt calm and cooperative, reading book. Sitter at bedside.

## 2021-02-10 MED ORDER — DIPHENHYDRAMINE HCL 25 MG PO CAPS
25.0000 mg | ORAL_CAPSULE | Freq: Once | ORAL | Status: AC
  Administered 2021-02-10: 25 mg via ORAL
  Filled 2021-02-10: qty 1

## 2021-02-10 MED ORDER — LORAZEPAM 1 MG PO TABS
2.0000 mg | ORAL_TABLET | Freq: Once | ORAL | Status: AC
  Administered 2021-02-10: 2 mg via ORAL
  Filled 2021-02-10: qty 2

## 2021-02-10 MED ORDER — CALAMINE EX LOTN
TOPICAL_LOTION | Freq: Once | CUTANEOUS | Status: AC
  Filled 2021-02-10: qty 177

## 2021-02-10 NOTE — ED Notes (Signed)
Pt requesting drink and tylenol for rib pain.

## 2021-02-10 NOTE — ED Notes (Signed)
Pt finally able to fall asleep, will get next set of VS when pt wake up.  Pt does not appear in distress, respirations are even and non-labored

## 2021-02-10 NOTE — ED Notes (Signed)
Pt used phone to call family

## 2021-02-10 NOTE — ED Notes (Signed)
Patient transported to Ultrasound 

## 2021-02-10 NOTE — ED Provider Notes (Signed)
Emergency Medicine Observation Re-evaluation Note  Brian Cardenas is a 20 y.o. male, seen on rounds today.  Pt initially presented to the ED for complaints of Agitation and Mental Health Problem Currently, the patient is awaiting to be placed back in his group home . He is sitting in bed in NAD with no complaints today. He is eating breakfast.   Physical Exam  BP (!) 138/94 (BP Location: Right Arm)   Pulse (!) 102   Temp 98.4 F (36.9 C) (Oral)   Resp 18   SpO2 99%  Physical Exam General: alert, NAD Cardiac: slightly tachycardic Lungs: no respiratory distress Psych: calm, cooperative  ED Course / MDM  EKG:EKG Interpretation  Date/Time:  Tuesday February 06 2021 20:37:38 EDT Ventricular Rate:  150 PR Interval:  134 QRS Duration: 88 QT Interval:  262 QTC Calculation: 413 R Axis:   119 Text Interpretation: Sinus tachycardia Right axis deviation Abnormal ECG No old tracing to compare Confirmed by [name] ([id number]) on 02/07/2021 2:36:25 AM   I have reviewed the labs performed to date as well as medications administered while in observation.  Recent changes in the last 24 hours include no acute changes overnight.  Plan  Current plan is for transfer back to his group home. Patient is not under full IVC at this time.   Brian Cardenas 02/10/21 1003    Gwyneth Sprout, MD 02/10/21 2118

## 2021-02-10 NOTE — ED Provider Notes (Signed)
Mr. Brian Cardenas is standing in his doorway.  He states that he is quite anxious and bored.  He has been here for 5 days.  He is requesting medication for anxiety, and I have written Ativan.   Koleen Distance, MD 02/10/21 1254

## 2021-02-10 NOTE — ED Notes (Signed)
Pt awake and ambulatory to restroom, calm and cooperative with staff.  States that he has not been able to fall asleep tonight. Pt request for TV to be turned back on.   Pt does not appear in distress, respirations are even and non-labored

## 2021-02-11 NOTE — ED Notes (Signed)
Pt lunch tray delivered.

## 2021-02-11 NOTE — ED Notes (Signed)
Pt up at nurses station using phone  
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
## 2021-02-12 NOTE — ED Provider Notes (Signed)
Emergency Medicine Observation Re-evaluation Note  Brian Cardenas is a 20 y.o. male, seen on rounds today.  Pt initially presented to the ED for complaints of Agitation and Mental Health Problem Currently, the patient is resting comfortably.  He reports some itching to his arms where he has had a rash that has been ongoing for several weeks.  Physical Exam  BP 123/77 (BP Location: Right Arm)   Pulse 97   Temp 97.7 F (36.5 C) (Oral)   Resp 18   SpO2 92%  Physical Exam General: Resting comfortably Cardiac: Regular rate and rhythm Lungs: Clear to auscultation bilaterally. Skin: Erythematous micropapular rash noted to the bilateral upper extremities/forearm areas. PsychPeri Jefferson speech  ED Course / MDM  EKG:EKG Interpretation  Date/Time:  Tuesday February 06 2021 20:37:38 EDT Ventricular Rate:  150 PR Interval:  134 QRS Duration: 88 QT Interval:  262 QTC Calculation: 413 R Axis:   119 Text Interpretation: Sinus tachycardia Right axis deviation Abnormal ECG No old tracing to compare Confirmed by [name] ([id number]) on 02/07/2021 2:36:25 AM   I have reviewed the labs performed to date as well as medications administered while in observation.  Recent changes in the last 24 hours include N/A.  Plan  Current plan is for discharge back to group home. Patient is not under full IVC at this time.   Maxwell Caul, PA-C 02/12/21 7619    Eber Hong, MD 02/16/21 [phone]

## 2021-02-12 NOTE — ED Notes (Signed)
Patient was given a Cup of Coke. 

## 2021-02-13 NOTE — ED Provider Notes (Signed)
Emergency Medicine Observation Re-evaluation Note  Brian Cardenas is a 20 y.o. male, seen on rounds today.  Pt initially presented to the ED for complaints of Agitation and Mental Health Problem Currently, the patient is sleeping.  Physical Exam  BP 115/67 (BP Location: Right Arm)   Pulse 90   Temp 98 F (36.7 C) (Oral)   Resp 17   SpO2 98%  Physical Exam General: No acute distress Cardiac: Well-perfused Lungs: Nonlabored Psych: Cooperative  ED Course / MDM  EKG:EKG Interpretation  Date/Time:  Tuesday February 06 2021 20:37:38 EDT Ventricular Rate:  150 PR Interval:  134 QRS Duration: 88 QT Interval:  262 QTC Calculation: 413 R Axis:   119 Text Interpretation: Sinus tachycardia Right axis deviation Abnormal ECG No old tracing to compare Confirmed by [name] ([id number]) on 02/07/2021 2:36:25 AM   I have reviewed the labs performed to date as well as medications administered while in observation.  Recent changes in the last 24 hours include psychiatric clearance.  Social work working on return to group home versus placement.  Plan  Current plan is for Placement Patient is not under full IVC at this time.   Terrilee Files, MD 02/13/21 1911

## 2021-02-13 NOTE — ED Notes (Signed)
Snacks and a Drink was placed on bedside table.

## 2021-02-13 NOTE — ED Notes (Signed)
Group Home Placement Breakfast orders placed

## 2021-02-13 NOTE — Progress Notes (Signed)
TOC leadership is meeting with Program Manager at [facility] to discuss concerns of patient being in the ED.

## 2021-02-14 ENCOUNTER — Other Ambulatory Visit: Payer: Self-pay

## 2021-02-14 NOTE — ED Provider Notes (Signed)
Emergency Medicine Observation Re-evaluation Note  Brian Cardenas is a 20 y.o. male, seen on rounds today.  Pt initially presented to the ED for complaints of agitated behavior at group home.  Pt with hx same, intermittently in past, and currently appears at baseline.    Physical Exam  BP (!) 101/55 (BP Location: Left Arm)   Pulse 65   Temp 97.9 F (36.6 C)   Resp 18   SpO2 93%  Physical Exam General: alert, content.  Cardiac: regular rate Lungs: breathing comfortably Psych: alert, content, calm.   ED Course / MDM  EKG:EKG Interpretation  Date/Time:  Tuesday February 06 2021 20:37:38 EDT Ventricular Rate:  150 PR Interval:  134 QRS Duration: 88 QT Interval:  262 QTC Calculation: 413 R Axis:   119 Text Interpretation: Sinus tachycardia Right axis deviation Abnormal ECG No old tracing to compare Confirmed by [name] ([id number]) on 02/07/2021 2:36:25 AM   I have reviewed the labs performed to date as well as medications administered while in observation.  Recent changes in the last 24 hours include continued stabilization on current meds.   Plan  Current plan is for [facility] team to facilitate return to group home.  It appears that group home and LME did not facilitate a 'legal/appropriate' discharge from current group home, including not giving adequate notice of discharge and/or finding new residence for patient.    TOC team will need to file grievance with state, and have senior [facility] leader discussion with group home and LME to facilitate appropriate movement of patient to his residence.   Pt remains calm, alert, cooperative, and does not require continued stay in hospital/ED.      Cathren Laine, MD 02/14/21 [phone]

## 2021-02-14 NOTE — ED Notes (Signed)
PT remains calm and cooperative.

## 2021-02-14 NOTE — ED Notes (Signed)
Provided with ice cream and orange juice at this time.

## 2021-02-14 NOTE — Progress Notes (Signed)
CSW received information that [facility]PS is still trying to get patient into a locked down facility even though patient was psych cleared by psychiatry last week. APS is seeking out of state locked programs. It's unknown how long it will take for [facility]PS to secure a bed.

## 2021-02-15 MED ORDER — CEPHALEXIN 250 MG PO CAPS
500.0000 mg | ORAL_CAPSULE | Freq: Three times a day (TID) | ORAL | Status: AC
  Administered 2021-02-16 – 2021-02-22 (×21): 500 mg via ORAL
  Filled 2021-02-15 (×21): qty 2

## 2021-02-15 NOTE — Progress Notes (Signed)
CSW attempted to contact number listed for [facility] Group [facility]. Phone just rang then just said call cannot be completed as dialed.

## 2021-02-15 NOTE — ED Notes (Addendum)
Bandage changed on wound to L great toe; toe noted to be read, warm; pt also has not urinated today, denies urge to go at this time; states last urinated yesterday; denies abdominal discomfort; Dr. Pilar Plate notified of toe and urinary concerns

## 2021-02-15 NOTE — ED Provider Notes (Incomplete)
I was asked to evaluate the patient for a possible wound infection.  He has been picking at his right great toe and he has a small wound at the medial aspect of the toenail.  The surrounding area is erythematous.

## 2021-02-15 NOTE — ED Provider Notes (Signed)
I was asked to evaluate the patient for a possible wound infection.  He has been picking at his right great toe and he has a small wound at the medial aspect of the toenail.  The surrounding area is erythematous.  Suspect mild superficial wound infection, no fluctuance, no expression of purulent material.  Providing Keflex.   Sabas Sous, MD 02/16/21 [phone]

## 2021-02-15 NOTE — ED Notes (Signed)
Pt alert, eating lunch, calm and cooperative at this time

## 2021-02-15 NOTE — ED Notes (Signed)
Dinner tray at bedside

## 2021-02-15 NOTE — Progress Notes (Signed)
CSW received a call from [employer] from [facility]. Ms. Anne Hahn stated that she wanted to come to the hospital last week to do some coaching with patient. Ms. Anne Hahn stated that it would be about a hour of positive emotions and validation. Ms. Anne Hahn stated from her impression that [place] spoke with CSW about this. CSW stated no and that she has not spoken to [facility] in a week. Ms. Anne Hahn stated she apologize for CSW not being aware of this. Ms. Anne Hahn stated she would be there Monday 02/19/21 around 2 PM. Ms. Anne Hahn also asked if she could bring some coloring books and sensory objects. CSW stated that she would double check with the nursing staff to make sure its okay.   Mallory Willis  [phone] [email or web address]    *CSW spoke with nursing staff who stated the coloring books would be fine. Nursing stated it depends on what type of sensory object so patient does not hurt himself with it. Ms. Anne Hahn indicated a stress ball.

## 2021-02-15 NOTE — ED Provider Notes (Signed)
Emergency Medicine Observation Re-evaluation Note  Brian Cardenas is a 20 y.o. male, seen on rounds today.  Pt initially presented to the ED for complaints of Agitation and Mental Health Problem Currently, the patient is sleeping, in no distress.  No reported change from nurses overnight, no new episodes of agitation reported.  Physical Exam  BP 124/85 (BP Location: Right Arm)   Pulse 79   Temp 97.8 F (36.6 C) (Oral)   Resp 15   Ht 6\' 1"  (1.854 m)   Wt 119.7 kg   SpO2 100%   BMI 34.82 kg/m  Physical Exam General: No distress, sleeping Musculoskeletal: No deformities, moving about in his sleep. Lungs: No increased work of breathing, no tachypnea Psych: Sleeping, most recent evaluation notable for persistent request for returning to group home.  Reports of a calm demeanor per nursing staff.  ED Course / MDM  EKG:EKG Interpretation  Date/Time:  Tuesday February 06 2021 20:37:38 EDT Ventricular Rate:  150 PR Interval:  134 QRS Duration: 88 QT Interval:  262 QTC Calculation: 413 R Axis:   119 Text Interpretation: Sinus tachycardia Right axis deviation Abnormal ECG No old tracing to compare Confirmed by [id number] ([name]) on 02/07/2021 2:36:25 AM   I have reviewed the labs performed to date as well as medications administered while in observation.  Recent changes in the last 24 hours include none, ongoing efforts from [facility], appropriate social service agencies to facilitate group home return/new placement.  Plan  Current plan is for as above, placement.  Patient has reportedly been calm, is currently sleeping.  He does not require additional stay in the hospital/emergency department.   [facility], MD 02/15/21 [phone]

## 2021-02-15 NOTE — ED Notes (Signed)
Dr Bero at bedside.  

## 2021-02-15 NOTE — Progress Notes (Signed)
CSW received an e-mail form [facility] stating that a Ms. Porter from [facility] group home may reach out to CSW and [facility]PS. CSW contacted [facility] to get further information. Ms. Malachy Mood mailbox is full and CSW was unable to leave a message.

## 2021-02-16 NOTE — ED Notes (Signed)
Pt has red, raised rash to bilat arms. C/o itching and its keeping him awake. MD notified. Calamine lotion applied to area.

## 2021-02-16 NOTE — Progress Notes (Signed)
CSW notified patient of his meeting with [facility] at 1:00 PM today. Patient asked if Ms. Turner from [facility] was going to be there. CSW stated she did not know.

## 2021-02-16 NOTE — ED Provider Notes (Signed)
  Physical Exam  BP 100/74 (BP Location: Right Arm)   Pulse 67   Temp 97.9 F (36.6 C) (Oral)   Resp 20   Ht 6\' 1"  (1.854 m)   Wt 119.7 kg   SpO2 100%   BMI 34.82 kg/m   Physical Exam  ED Course/Procedures     Procedures  MDM  Patient still pending group home placement.  Has been started on antibiotics for possible toe infection.  Medically cleared.       , MD 02/16/21 [phone]

## 2021-02-16 NOTE — ED Notes (Signed)
Patient at desk making last phone call of the day-Monique,RN

## 2021-02-16 NOTE — Progress Notes (Signed)
Zoom meeting was completed with [facility]. The group home is requesting [facility] and [facility] fill-out patients application. Patient cannot move forward in the placement process until it's filled out and returned. The group home stated once its filled out then they will schedule a face to face visit. If patient is accepted then he can be discharged 2 days after the face to face visit.

## 2021-02-17 MED ORDER — TRIAMCINOLONE 0.1 % CREAM:EUCERIN CREAM 1:1
TOPICAL_CREAM | Freq: Three times a day (TID) | CUTANEOUS | Status: DC
  Administered 2021-03-01 – 2021-03-07 (×3): 1 via TOPICAL
  Filled 2021-02-17 (×4): qty 1

## 2021-02-17 NOTE — ED Provider Notes (Signed)
Patient awaiting placement in a group home.  Apparently [facility]PS is involved, and needs to give input prior to transfer.  Today patient is complaining of ongoing rash that has been present for "a long time."  Since being here calamine has been applied without relief of discomfort.  He complains of itching from the rash.  Patient is alert nontoxic.  He has a red,m raised rash of the bilateral volar forearms, extending to the upper arms, left greater than right.  Rash is somewhat excoriated, characterized by confluent papular eruption, with some dryness of the overlying skin.  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  Will discontinue calamine, and try triamcinolone cream.  Vitals:   02/17/21 1218 02/17/21 1356  BP: (!) 148/74 120/85  Pulse:  75  Resp:  18  Temp:  98.1 F (36.7 C)  SpO2:  100%      Mancel Bale, MD 02/17/21 1830

## 2021-02-18 NOTE — ED Provider Notes (Signed)
Emergency Medicine Observation Re-evaluation Note  Brian Cardenas is a 20 y.o. male, seen on rounds today.  Pt initially presented to the ED for complaints of Agitation and Mental Health Problem Currently, the patient is standing in his room, has completed lunch.  Physical Exam  BP 115/73 (BP Location: Left Arm)   Pulse 79   Temp 98 F (36.7 C) (Oral)   Resp 18   Ht 6\' 1"  (1.854 m)   Wt 119.7 kg   SpO2 96%   BMI 34.82 kg/m  Physical Exam General: Rash to bilateral forearms, feels like this is improving Lungs: Respirations even and unlabored. Psych: Poor eye contact, withdrawn  ED Course / MDM  EKG:EKG Interpretation  Date/Time:  Tuesday February 06 2021 20:37:38 EDT Ventricular Rate:  150 PR Interval:  134 QRS Duration: 88 QT Interval:  262 QTC Calculation: 413 R Axis:   119 Text Interpretation: Sinus tachycardia Right axis deviation Abnormal ECG No old tracing to compare Confirmed by [id number] ([name]) on 02/07/2021 2:36:25 AM   I have reviewed the labs performed to date as well as medications administered while in observation.  Recent changes in the last 24 hours include rash to forearms for itching, seems to have helped.  Awaiting APS assistance with placement.  Plan  Current plan is for awaiting [facility]PS assistance with placement.    02/09/2021, PA-C 02/18/21 1533    04/20/21, MD 02/19/21 [phone]

## 2021-02-18 NOTE — ED Notes (Signed)
Patient resting comfortably. No distress. Several beverages available at bedside. Patient has TV on channel of choice (funny video clips). Patient w no requests at this time.

## 2021-02-18 NOTE — ED Notes (Signed)
Patient care received at this time. Patient is awake, eating his breakfast and no distress or agitation noted. He is pleasant and responding appropriately to RN. No requests at this time. Will continue to monitor safety and needs.

## 2021-02-19 NOTE — ED Notes (Signed)
Pt noted to undress infected area to big toe and re-dress it repeatedly, appears to be "picking" at area. This nurse educated pt that behaviors will encourage infection to worsen, dressing should be left alone and wrapped daily. Pt fails to listen to advisory; continually unwrapping and re-wrapping area. Will continue to monitor for any obvious worsening of area.

## 2021-02-19 NOTE — ED Provider Notes (Signed)
  Physical Exam  BP 126/82   Pulse 75   Temp 98.2 F (36.8 C) (Oral)   Resp 16   Ht 6\' 1"  (1.854 m)   Wt 119.7 kg   SpO2 95%   BMI 34.82 kg/m   Physical Exam  ED Course/Procedures     Procedures  MDM  Pending placement.  Also reportedly pending some paperwork by Adult .  Reviewing notes 1 group home may be excepting him but will be 2 days after a face-to-face evaluation but I do not think that has been done yet.       [employer], MD 02/19/21 [phone]

## 2021-02-19 NOTE — Progress Notes (Signed)
CSW contacted APS to make sure they received the [facility] group [facility] e-mail. Ms. Mayford Knife confirmed and stated she will be filling out the application today. CSW also received an email from [place] late Friday evening about [facility] wanting to setup a meeting with patient.

## 2021-02-20 NOTE — Progress Notes (Signed)
CSW facilitated a WebEx meeting for an interview with Pt, APS guardian Rykiell Turner, [place] of  [facility] and potential [facility] caregiver Malachi Bonds.  Pt was calm and cooperative and was able to answer questions and was afforded time to ask questions.  Pt reported feeling positive about the interview.

## 2021-02-20 NOTE — ED Notes (Signed)
Pt at nurses desk to call his grandparents at this time

## 2021-02-20 NOTE — ED Notes (Signed)
Patient request to call grandparent - currently using phone at nurses station with RN approval

## 2021-02-20 NOTE — ED Provider Notes (Signed)
Emergency Medicine Observation Re-evaluation Note  Brian Cardenas is a 20 y.o. male, seen on rounds today.  Pt initially presented to the ED for complaints of Agitation and Mental Health Problem Currently, the patient is resting.  Physical Exam  BP 103/67 (BP Location: Left Arm)   Pulse 79   Temp 98.6 F (37 C) (Oral)   Resp 16   Ht 6\' 1"  (1.854 m)   Wt 119.7 kg   SpO2 94%   BMI 34.82 kg/m  Physical Exam General: resting comfortably, NAD Lungs: normal WOB Psych: currently calm and resting  ED Course / MDM  EKG:EKG Interpretation  Date/Time:  Tuesday February 06 2021 20:37:38 EDT Ventricular Rate:  150 PR Interval:  134 QRS Duration: 88 QT Interval:  262 QTC Calculation: 413 R Axis:   119 Text Interpretation: Sinus tachycardia Right axis deviation Abnormal ECG No old tracing to compare Confirmed by 06-04-1975 ([name]) on 02/07/2021 2:36:25 AM   I have reviewed the labs performed to date as well as medications administered while in observation.  Recent changes in the last 24 hours include none.  Plan  Current plan is for APS to assist with placement.   02/09/2021, Rozelle Logan 02/20/21 04/22/21

## 2021-02-20 NOTE — ED Notes (Signed)
Patient awake and finished breakfast. Back in bed resting.

## 2021-02-20 NOTE — ED Notes (Signed)
Dinner tray arrived and patient ate 100% of dinner

## 2021-02-21 NOTE — Progress Notes (Signed)
CSW received call from DSS, caseworker states possible placement at [facility]. They have requested an in person screen with pt on Friday 4/15 at 1pm.

## 2021-02-22 MED ORDER — BACITRACIN ZINC 500 UNIT/GM EX OINT
TOPICAL_OINTMENT | Freq: Two times a day (BID) | CUTANEOUS | Status: AC
  Administered 2021-02-27: 1 via TOPICAL
  Filled 2021-02-22: qty 0.9

## 2021-02-22 MED ORDER — LIDOCAINE HCL (PF) 1 % IJ SOLN
5.0000 mL | Freq: Once | INTRAMUSCULAR | Status: AC
  Administered 2021-02-22: 5 mL
  Filled 2021-02-22: qty 5

## 2021-02-22 NOTE — ED Notes (Signed)
Vernona Rieger ,PA at bed side to assess Pt's Lt great toe.  Pt's toe has been open to air during the day.

## 2021-02-22 NOTE — ED Notes (Signed)
Pressure dsy applied by Provider . Start soaks in AM

## 2021-02-22 NOTE — ED Notes (Signed)
Pt had ingrown toe nail removed from Lt great toe.

## 2021-02-22 NOTE — Consult Note (Addendum)
WOC Nurse Consult Note: Reason for Consult: Left ingrown toenail.  Unfortunately, this is outside the scope of WOC nursing practice.  I have communicated to the PA-C and the Bedside RN that the only intervention I am able to provide is to apply a betadine swabstick and allow to air-dry.  I have given guidance and communicated with the ordering PA that perhaps Podiatric Medicine might see the patient for this problem while in house.   WOC nursing team will not follow, but will remain available to this patient, the nursing and medical teams.  Please re-consult if needed. Thanks, Ladona Mow, MSN, RN, GNP, [place]  Pager# [phone]

## 2021-02-22 NOTE — ED Provider Notes (Signed)
To bedside for left great infection, ingrown toenail.  Patient has been on Keflex, has drainage, swelling.  Offered to change antibiotics, and topical and start doing warm soaks versus removal of nail.  Patient would like to have the nail removed. Excise ingrown toenail  Date/Time: 02/22/2021 5:36 PM Performed by: Jeannie Fend, PA-C Authorized by: Jeannie Fend, PA-C  Consent: Verbal consent obtained. Consent given by: patient Patient identity confirmed: verbally with patient Preparation: Patient was prepped and draped in the usual sterile fashion. Local anesthesia used: yes Anesthesia: digital block  Anesthesia: Local anesthesia used: yes Local Anesthetic: lidocaine 1% without epinephrine  Sedation: Patient sedated: no  Patient tolerance: patient tolerated the procedure well with no immediate complications    Consult to wound care for assistance. Ordered BID topical bacitracin. Daily foot soaks.    Jeannie Fend, PA-C 02/22/21 1759    Linwood Dibbles, MD 02/22/21 [phone]

## 2021-02-22 NOTE — ED Provider Notes (Signed)
Emergency Medicine Observation Re-evaluation Note  Brian Cardenas is a 20 y.o. male, seen on rounds today.  Pt initially presented to the ED for complaints of Agitation and Mental Health Problem Currently, the patient is resting.  Physical Exam  BP 104/61 (BP Location: Left Arm)   Pulse 74   Temp 97.9 F (36.6 C) (Oral)   Resp 18   Ht 6\' 1"  (1.854 m)   Wt 119.7 kg   SpO2 93%   BMI 34.82 kg/m  Physical Exam General: resting comfortably, NAD Lungs: normal WOB Psych: currently calm and resting  ED Course / MDM  EKG:EKG Interpretation  Date/Time:  Tuesday February 06 2021 20:37:38 EDT Ventricular Rate:  150 PR Interval:  134 QRS Duration: 88 QT Interval:  262 QTC Calculation: 413 R Axis:   119 Text Interpretation: Sinus tachycardia Right axis deviation Abnormal ECG No old tracing to compare Confirmed by 06-04-1976 ([name]) on 02/07/2021 2:36:25 AM   I have reviewed the labs performed to date as well as medications administered while in observation.  Recent changes in the last 24 hours include none.  Plan  Current plan is for placement at a behavioral facility, he is pending an in person evaluation tomorrow for possible placement.   02/09/2021, Brian Cardenas 02/22/21 [phone]

## 2021-02-23 NOTE — ED Provider Notes (Signed)
Emergency Medicine Observation Re-evaluation Note  Brian Cardenas is a 20 y.o. male, seen on rounds today.  Pt initially presented to the ED for complaints of Agitation and Mental Health Problem Currently, the patient is awake, states he does not need anything at the moment.  Physical Exam  BP 129/83 (BP Location: Right Arm)   Pulse 76   Temp 98.1 F (36.7 C) (Oral)   Resp 16   Ht 6\' 1"  (1.854 m)   Wt 119.7 kg   SpO2 100%   BMI 34.82 kg/m  Physical Exam General: comfortable, nad Lungs: no distress Psych: calm, cooperative  ED Course / MDM  EKG:EKG Interpretation  Date/Time:  Tuesday February 06 2021 20:37:38 EDT Ventricular Rate:  150 PR Interval:  134 QRS Duration: 88 QT Interval:  262 QTC Calculation: 413 R Axis:   119 Text Interpretation: Sinus tachycardia Right axis deviation Abnormal ECG No old tracing to compare Confirmed by 06-04-1976 ([name]) on 02/07/2021 2:36:25 AM   I have reviewed the labs performed to date as well as medications administered while in observation.  Recent changes in the last 24 hours include excision of ingrown toenail.  Plan  Current plan is for [facility] placmement. Patient is not under full IVC at this time.   [facility], MD 02/23/21 [phone]

## 2021-02-24 NOTE — ED Notes (Signed)
Received verbal report from Kachina S. RN 

## 2021-02-25 NOTE — ED Provider Notes (Addendum)
Emergency Medicine Observation Re-evaluation Note  Brian Cardenas is a 20 y.o. male, seen on rounds today.  Pt initially presented to the ED for complaints of Agitation and Mental Health Problem Currently, the patient is asleep.  Nursing staff report no acute overnight events.  Physical Exam  BP 105/73 (BP Location: Right Arm)   Pulse 83   Temp (!) 97.5 F (36.4 C) (Oral)   Resp 16   Ht 6\' 1"  (1.854 m)   Wt 119.7 kg   SpO2 99%   BMI 34.82 kg/m  Physical Exam General: comfortable, nad Lungs: no distress Psych: Unchanged  ED Course / MDM  EKG:EKG Interpretation  Date/Time:  Tuesday February 06 2021 20:37:38 EDT Ventricular Rate:  150 PR Interval:  134 QRS Duration: 88 QT Interval:  262 QTC Calculation: 413 R Axis:   119 Text Interpretation: Sinus tachycardia Right axis deviation Abnormal ECG No old tracing to compare Confirmed by 06-04-1976 ([name]) on 02/07/2021 2:36:25 AM   I have reviewed the labs performed to date as well as medications administered while in observation.  Recent changes in the last 24 hours include excision of ingrown toenail.  Plan  Current plan is for placement in a group home. Patient is not under full IVC at this time.      02/09/2021, PA-C 02/25/21 02/27/21    [id number], MD 02/27/21 1131

## 2021-02-26 NOTE — Progress Notes (Signed)
This CSW received an email from [facility] that stated that they are still reviewing the application.

## 2021-02-26 NOTE — Progress Notes (Signed)
Email sent to [facility] and APS to follow-up on placement.

## 2021-02-26 NOTE — Progress Notes (Signed)
CSW received message from Ms. Turners email (APS)   I will be out of the office beginning 02/23/2021 and will return on 03/01/2021. I will follow-up with all emails and voicemails upon my return. For any urgent matters, please contact Geraldine Solar at [phone].

## 2021-02-26 NOTE — ED Notes (Signed)
TOC/group home Placed Breakfast order

## 2021-02-26 NOTE — ED Provider Notes (Signed)
Emergency Medicine Observation Re-evaluation Note  Brian Cardenas is a 20 y.o. male, seen on rounds today.  Pt initially presented to the ED for complaints of Agitation and Mental Health Problem Currently, the patient is watching TV.  Physical Exam  BP 117/71 (BP Location: Right Arm)   Pulse 67   Temp 98.2 F (36.8 C) (Oral)   Resp 18   Ht 6\' 1"  (1.854 m)   Wt 119.7 kg   SpO2 100%   BMI 34.82 kg/m  Physical Exam General: no acute distress Cardiac: not assessed Lungs: normal effort Psych: no agitation  ED Course / MDM  EKG:EKG Interpretation  Date/Time:  Tuesday February 06 2021 20:37:38 EDT Ventricular Rate:  150 PR Interval:  134 QRS Duration: 88 QT Interval:  262 QTC Calculation: 413 R Axis:   119 Text Interpretation: Sinus tachycardia Right axis deviation Abnormal ECG No old tracing to compare Confirmed by 06-04-1976 ([name]) on 02/07/2021 2:36:25 AM   I have reviewed the labs performed to date as well as medications administered while in observation.  No recent changes in the last 24 hours.  Plan  Current plan is for group home placement.    02/09/2021, MD 02/26/21 1030

## 2021-02-26 NOTE — Progress Notes (Signed)
CSW received a response back from [facility] in regards to an update on patients interview with [facility] and [facility].    To my knowledge,  I am under the understanding that both of the fore-mentioned providers are still reviewing with their respective teams if they are interested in [place] and providing him possible placement.

## 2021-02-27 NOTE — ED Notes (Signed)
Pt out of room ambulated to bathroom without difficulty.

## 2021-02-27 NOTE — ED Notes (Signed)
Pt ambulated to self to bathroom

## 2021-02-28 NOTE — Progress Notes (Signed)
CSW called DSS guardian Rykiell Turner to discuss placement options/plans for Pt.  Left voicemail requesting callback.

## 2021-02-28 NOTE — ED Notes (Signed)
Therapist at bedside.

## 2021-02-28 NOTE — ED Notes (Signed)
Pt ambulated self to bathroom.  

## 2021-02-28 NOTE — ED Notes (Signed)
Pt given orange juice.

## 2021-02-28 NOTE — ED Provider Notes (Signed)
At this time the patient is sitting on the bed, and conversant.  We talked about one of his interests, working with horses.  He was able eat breakfast.  Per nursing he has been cooperative.  He is waiting for placement into a group home.   Mancel Bale, MD 02/28/21 [phone]

## 2021-02-28 NOTE — ED Notes (Signed)
Therapist at bedside would like pt to call crisis line anytime he feels depressed here. The number is [phone].

## 2021-02-28 NOTE — ED Notes (Signed)
Diet sprite given to pt 

## 2021-03-01 NOTE — Progress Notes (Signed)
CSW coordinated a WebEx meeting with DSS guardian, [facility] and and [employer]. For Friday 4/22 at 1pm.

## 2021-03-01 NOTE — ED Notes (Signed)
Pt refusing dinner tray, reports being tired. Pt back to sleep. NADN. Sitter remains at bedside.

## 2021-03-01 NOTE — ED Notes (Signed)
Pt sitting up in bed having breakfast. Talking with sitter. Denies any complaints at this time.

## 2021-03-01 NOTE — ED Provider Notes (Signed)
Emergency Medicine Observation Re-evaluation Note  Brian Cardenas is a 20 y.o. male, seen on rounds today.  Pt initially presented to the ED for complaints of Agitation and Mental Health Problem Currently, the patient is watching TV, no concerns at this time.  Physical Exam  BP 124/68 (BP Location: Left Arm)   Pulse 68   Temp 98.8 F (37.1 C) (Oral)   Resp 16   Ht 6\' 1"  (1.854 m)   Wt 119.7 kg   SpO2 97%   BMI 34.82 kg/m  Physical Exam General: well appearing Cardiac: normal rate Lungs: respirations even, unlabored Psych: calm, pleasant  ED Course / MDM  EKG:EKG Interpretation  Date/Time:  Tuesday February 06 2021 20:37:38 EDT Ventricular Rate:  150 PR Interval:  134 QRS Duration: 88 QT Interval:  262 QTC Calculation: 413 R Axis:   119 Text Interpretation: Sinus tachycardia Right axis deviation Abnormal ECG No old tracing to compare Confirmed by 06-04-1976 ([name]) on 02/07/2021 2:36:25 AM   I have reviewed the labs performed to date as well as medications administered while in observation.  Recent changes in the last 24 hours include none.  Plan  Current plan is for DC to group home, awaiting placement. Patient is not under full IVC at this time.   02/09/2021 03/01/21 03/03/21    2229, MD 03/05/21 [phone]

## 2021-03-01 NOTE — ED Notes (Signed)
Cafeteria to bring pt a hamburger and french fries per pt request.

## 2021-03-02 NOTE — Progress Notes (Signed)
CSW spoke vai phone with DSS guardian Pietro Cassis who is seeking to coordinate an interview with [facility].  Pt received bed offer from [facility] yesterday that guardian declined.  CSW staffed case with [facility] supervisor Jiles Crocker.

## 2021-03-02 NOTE — ED Notes (Signed)
Pt is sleeping at this time.

## 2021-03-02 NOTE — ED Notes (Signed)
Breakfast Ordered 

## 2021-03-02 NOTE — ED Notes (Signed)
Dinner tray at bedside pt states he does not eat fish and is requesting cheese burgers and fries This RN called dietary

## 2021-03-02 NOTE — ED Notes (Signed)
Pt is eating.  

## 2021-03-02 NOTE — Progress Notes (Signed)
TOC CSW received a call from Jones Bales, DSS SW [phone] and office phone [phone] was returning [place], LCSW phone call.  CSW will continue to follow for dc needs.  Shae Augello Tarpley-Carter, MSW, LCSW-A Pronouns:  She, Her, Hers                  [facility] Transitions of CareClinical Social Worker [email or web address] [phone]

## 2021-03-03 NOTE — ED Notes (Signed)
Pt resting in bed no needs at this time.

## 2021-03-03 NOTE — ED Notes (Signed)
Pt watching tv

## 2021-03-03 NOTE — ED Notes (Signed)
Patient is resting comfortably. 

## 2021-03-03 NOTE — ED Notes (Signed)
Pt made phone call to grandparent

## 2021-03-03 NOTE — ED Notes (Signed)
Pt received lunch tray 

## 2021-03-03 NOTE — ED Notes (Signed)
Pt awakened to give meds scheduled

## 2021-03-03 NOTE — ED Notes (Signed)
Pt received breakfast tray pt awake and eating.

## 2021-03-04 NOTE — ED Provider Notes (Signed)
Emergency Medicine Observation Re-evaluation Note  Brian Cardenas is a 20 y.o. male, seen on rounds today.  Pt initially presented to the ED for complaints of Agitation and Mental Health Problem Currently, the patient is resting.  Physical Exam  BP 134/71 (BP Location: Left Arm)   Pulse 85   Temp 98 F (36.7 C) (Oral)   Resp 20   Ht 6\' 1"  (1.854 m)   Wt 119.7 kg   SpO2 98%   BMI 34.82 kg/m  Physical Exam General: resting comfortably, NAD Lungs: normal WOB Psych: currently calm and resting  ED Course / MDM  EKG:EKG Interpretation  Date/Time:  Tuesday February 06 2021 20:37:38 EDT Ventricular Rate:  150 PR Interval:  134 QRS Duration: 88 QT Interval:  262 QTC Calculation: 413 R Axis:   119 Text Interpretation: Sinus tachycardia Right axis deviation Abnormal ECG No old tracing to compare Confirmed by 06-04-1976 ([name]) on 02/07/2021 2:36:25 AM   I have reviewed the labs performed to date as well as medications administered while in observation.  Recent changes in the last 24 hours include none.  Plan  Current plan is for awaiting placement.   02/09/2021, DO 03/04/21 1239

## 2021-03-05 NOTE — ED Notes (Signed)
I spoke with dietary and requested pt to have a cheese burger and fries.  Dietary will bring

## 2021-03-05 NOTE — ED Notes (Signed)
Patient given a Snack and drink.

## 2021-03-05 NOTE — ED Provider Notes (Signed)
Emergency Medicine Observation Re-evaluation Note  Brian Cardenas is a 20 y.o. male, seen on rounds today.  Pt initially presented to the ED for complaints of Agitation and Mental Health Problem Currently, the patient is rest in bed.  Physical Exam  BP 106/62 (BP Location: Left Arm)   Pulse 82   Temp 98.7 F (37.1 C)   Resp 20   Ht 6\' 1"  (1.854 m)   Wt 119.7 kg   SpO2 94%   BMI 34.82 kg/m  Physical Exam General: resting comfortable Cardiac: normal rate Lungs: unlabored Psych: calm, cooperative  ED Course / MDM  EKG:EKG Interpretation  Date/Time:  Tuesday February 06 2021 20:37:38 EDT Ventricular Rate:  150 PR Interval:  134 QRS Duration: 88 QT Interval:  262 QTC Calculation: 413 R Axis:   119 Text Interpretation: Sinus tachycardia Right axis deviation Abnormal ECG No old tracing to compare Confirmed by 06-04-1976 ([name]) on 02/07/2021 2:36:25 AM   I have reviewed the labs performed to date as well as medications administered while in observation.  Recent changes in the last 24 hours include none  Plan  Current plan is for awaiting group home placement. Patient is not under full IVC at this time.   02/09/2021 03/05/21 1432    03/07/21, MD 03/09/21 [phone]

## 2021-03-06 NOTE — Progress Notes (Signed)
Meeting held today with APS, [facility] [facility], and [facility]. Patient has possible placement at [facility], [facility], and [place]. APS worker is supposed to follow-up and clarify.

## 2021-03-06 NOTE — Progress Notes (Signed)
E-Mail sent to APS and [facility] for update on placement.

## 2021-03-06 NOTE — ED Notes (Signed)
Pt received breakfast tray 
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
## 2021-03-07 NOTE — ED Notes (Addendum)
Pt was given personal hygiene products and took a shower. Pt's bed linen was changed and pt was given clean clothes. 

## 2021-03-07 NOTE — ED Provider Notes (Signed)
Emergency Medicine Observation Re-evaluation Note  Brian Cardenas is a 20 y.o. male, seen on rounds today.  Pt initially presented to the ED for complaints of Agitation and Mental Health Problem Currently, the patient is awake and alert sitting upright in bed, watching television.  Physical Exam  BP 118/67 (BP Location: Left Arm)   Pulse 84   Temp 98 F (36.7 C) (Oral)   Resp 18   Ht 6\' 1"  (1.854 m)   Wt 119.7 kg   SpO2 97%   BMI 34.82 kg/m  Physical Exam General: No distress Cardiac: Regular rate Lungs: No increased work of breathing Psych: Little insight into his presentation for agitation  ED Course / MDM   I have reviewed the labs performed to date as well as medications administered while in observation.  Recent changes in the last 24 hours include no notable changes.  Patient does have some concern about an ingrown toenail.  There is trace erythema in the distal left great toe, no purulence, no drainage.  Patient encouraged to monitor the situation, keep medical staff apprised of changes.  Plan  Current plan is for group home placement. Seemingly there was an appropriate refusal to accept the patient back to his prior facility.  Social work is working on options.   , MD 03/07/21 [phone]

## 2021-03-07 NOTE — Progress Notes (Signed)
CSW sent e-mail to legal guardian and [facility] to follow-up on placement.

## 2021-03-07 NOTE — Progress Notes (Signed)
CSW received call from Hale Bogus of [facility]. That team will be at ED 4/28 at 2:30 to meet with Pt. This CSW will assist with meeting.

## 2021-03-07 NOTE — ED Notes (Signed)
Received verbal report from Alana at this time 

## 2021-03-07 NOTE — Progress Notes (Signed)
CSW received a call from [facility] who stated they will be coming to the hospital tomorrow at 1 PM to complete the last process in placement. If face to face visit goes well they will be able to take patient.

## 2021-03-07 NOTE — ED Notes (Signed)
Pt resting quietly when entered the room easy to arouse and took all night time medication. Advised that he just wanted to go back to sleep

## 2021-03-08 NOTE — ED Notes (Signed)
Breakfast tray arrived  

## 2021-03-08 NOTE — ED Notes (Signed)
Staff from [facility] at bedside to interview pt.

## 2021-03-08 NOTE — ED Notes (Signed)
Lunch tray arrived.   

## 2021-03-08 NOTE — ED Notes (Signed)
[place], MD at bedside to round on pt. 

## 2021-03-08 NOTE — ED Provider Notes (Signed)
Emergency Medicine Observation Re-evaluation Note  Brian Cardenas is a 20 y.o. male, seen on rounds today.  Pt initially presented to the ED for complaints of Agitation and Mental Health Problem Currently, the patient is in a similar condition as he was yesterday when I saw him on rounds.  He is awake, alert, sitting upright, no complaints, smiling.[name]  Physical Exam  BP 114/73 (BP Location: Right Arm)   Pulse 81   Temp (!) 97.4 F (36.3 C) (Oral)   Resp 18   Ht 6\' 1"  (1.854 m)   Wt 119.7 kg   SpO2 96%   BMI 34.82 kg/m  Physical Exam General: Awake and alert, no distress Cardiac: Regular rate and rhythm Lungs: No increased work of breathing Psych: Appropriately interactive ED Course / MDM  EKG:EKG Interpretation  Date/Time:  Tuesday February 06 2021 20:37:38 EDT Ventricular Rate:  150 PR Interval:  134 QRS Duration: 88 QT Interval:  262 QTC Calculation: 413 R Axis:   119 Text Interpretation: Sinus tachycardia Right axis deviation Abnormal ECG No old tracing to compare Confirmed by 06-04-1976 (Brian Cardenas) on 02/07/2021 2:36:25 AM   I have reviewed the labs performed to date as well as medications administered while in observation.  Recent changes in the last 24 hours include none.  Plan  Current plan is for placement in living facility with assistance of social work.   02/09/2021, MD 03/08/21 [phone]

## 2021-03-08 NOTE — ED Notes (Signed)
Pt ambulated to restroom at this time.

## 2021-03-08 NOTE — ED Notes (Signed)
Dinner tray arrived 

## 2021-03-08 NOTE — ED Notes (Signed)
Pt ambulated to the bathroom without difficulty.  

## 2021-03-09 NOTE — ED Notes (Signed)
Crisis response at bedside talking to pt.
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
## 2021-03-09 NOTE — ED Notes (Signed)
Pt is wanting to eat burger and fries for dinner. Order placed.

## 2021-03-09 NOTE — Progress Notes (Signed)
CSW left a message with Crystal from [facility].

## 2021-03-09 NOTE — ED Notes (Signed)
Pt is awake, alert and oriented. Took meds and compliant to rules at this time. Denies SI/HI or AV hallucinations. Safety precautions maintained.

## 2021-03-09 NOTE — Progress Notes (Signed)
Follow-up placement meeting took place today. [facility] accepted [facility] through provider Lyn Records stated they have an admission process to go through that could take up to 14 days. [facility] stated they can submit a form to expedite it. [facility] stated they can submit a request to see if patient can go to the resource center while intake is being processed. [facility] asked CSW to send over medication list. [facility] asked for day supports to be put in place by APS prior to patient going into Ms. Gloria's home. [facility] and APS plan on working on that immediately. Shelly Coss also will work on admissions today. The next meeting will be May 6th at 3:00 PM in hopes to secure a bed at the resource center.

## 2021-03-09 NOTE — Progress Notes (Signed)
Patient completed last steps of interview process with [facility]. E-mail sent to APS to verify if they will take patient. Pending response at this time.

## 2021-03-09 NOTE — ED Notes (Signed)
Pt got up and used restroom. Rn asked if pt needed anything, pt denies needing anything.

## 2021-03-09 NOTE — ED Notes (Signed)
Pt currently on the phone with his grandfather. Observed calm behavior at this time.

## 2021-03-10 NOTE — ED Notes (Signed)
Two females from group home are at the bedside to face to face conversation.

## 2021-03-10 NOTE — ED Provider Notes (Signed)
Emergency Medicine Observation Re-evaluation Note  Brian Cardenas is a 20 y.o. male, seen on rounds today.  Pt initially presented to the ED for complaints of Agitation and Mental Health Problem, needs new group home placement. Currently, the patient has been accepted to [facility], awaiting bed. Per last social work note, admission may take up to 14 days, in process of trying to expedite.  Physical Exam  BP 124/90   Pulse 87   Temp 97.9 F (36.6 C) (Oral)   Resp 18   Ht 6\' 1"  (1.854 m)   Wt 119.7 kg   SpO2 99%   BMI 34.82 kg/m  Physical Exam General: alert, coloring with markers and paper Cardiac: normal rate Lungs: normal work of breathing Psych: calm  ED Course / MDM  EKG:EKG Interpretation  Date/Time:  Tuesday February 06 2021 20:37:38 EDT Ventricular Rate:  150 PR Interval:  134 QRS Duration: 88 QT Interval:  262 QTC Calculation: 413 R Axis:   119 Text Interpretation: Sinus tachycardia Right axis deviation Abnormal ECG No old tracing to compare Confirmed by 06-04-1976 ([name]) on 02/07/2021 2:36:25 AM   I have reviewed the labs performed to date as well as medications administered while in observation.    Plan  Current plan is for pending admission process to [facility], has been accepted.    Effie Wahlert, 02/09/2021 N, PA-C 03/10/21 1018    03/12/21, MD 03/10/21 1312

## 2021-03-11 NOTE — ED Notes (Signed)
Pt ate dinner. Took meds with no complaints. Pt is cooperative with unit rules. No agitation observed. Pt is easy to redirect. Pt has been sleeping mostly since lunch time and watching tv intermittently. Safety precautions maintained. 1:1 with sitter continued.

## 2021-03-11 NOTE — ED Provider Notes (Signed)
Emergency Medicine Observation Re-evaluation Note  Brian Cardenas is a 20 y.o. male, seen on rounds today. Pt initially presented to the ED for complaints of Agitation and Mental Health Problem, needs new group home placement. Currently, the patient has been accepted to [facility], awaiting bed. Per last social work note, admission may take up to 14 days, in process of trying to expedite.  Physical Exam  BP 120/69 (BP Location: Left Arm)   Pulse 71   Temp 97.7 F (36.5 C) (Oral)   Resp 18   Ht 6\' 1"  (1.854 m)   Wt 119.7 kg   SpO2 98%   BMI 34.82 kg/m  Physical Exam General: alert, talking on the phone at nurses station Cardiac: normal rate Lungs: normal effort Psych: calm  ED Course / MDM  EKG:EKG Interpretation  Date/Time:  Tuesday February 06 2021 20:37:38 EDT Ventricular Rate:  150 PR Interval:  134 QRS Duration: 88 QT Interval:  262 QTC Calculation: 413 R Axis:   119 Text Interpretation: Sinus tachycardia Right axis deviation Abnormal ECG No old tracing to compare Confirmed by 06-04-1976 ([name]) on 02/07/2021 2:36:25 AM   I have reviewed the labs performed to date as well as medications administered while in observation.   Plan  Current plan is pending admission process to [facility], has been accepted.   Adisa Vigeant, 02/09/2021 N, PA-C 03/11/21 1121    05/11/21, MD 03/11/21 1152

## 2021-03-11 NOTE — ED Notes (Signed)
Pt sleeping at this time.

## 2021-03-11 NOTE — ED Notes (Signed)
Received verbal report from Katrina M RN 

## 2021-03-11 NOTE — ED Notes (Signed)
Pt received lunch tray 

## 2021-03-11 NOTE — ED Notes (Signed)
Breakfast Ordered 

## 2021-03-12 NOTE — ED Notes (Signed)
Breakfast order placed ?

## 2021-03-12 NOTE — ED Notes (Signed)
Pt resting in bed comfortably. Calm and cooperative.

## 2021-03-13 ENCOUNTER — Other Ambulatory Visit (HOSPITAL_COMMUNITY): Payer: Self-pay

## 2021-03-13 LAB — VALPROIC ACID LEVEL: Valproic Acid Lvl: 130 ug/mL — ABNORMAL HIGH (ref 50.0–100.0)

## 2021-03-13 MED ORDER — DIVALPROEX SODIUM 250 MG PO DR TAB
750.0000 mg | DELAYED_RELEASE_TABLET | Freq: Two times a day (BID) | ORAL | 0 refills | Status: DC
  Filled 2021-03-13: qty 180, 30d supply, fill #0

## 2021-03-13 MED ORDER — CHLORHEXIDINE GLUCONATE 0.12 % MT SOLN
15.0000 mL | Freq: Two times a day (BID) | OROMUCOSAL | 0 refills | Status: DC
  Filled 2021-03-13: qty 473, 16d supply, fill #0

## 2021-03-13 MED ORDER — HYDROCHLOROTHIAZIDE 12.5 MG PO TABS
12.5000 mg | ORAL_TABLET | Freq: Every day | ORAL | 0 refills | Status: DC
  Filled 2021-03-13: qty 30, 30d supply, fill #0

## 2021-03-13 MED ORDER — ROSUVASTATIN CALCIUM 20 MG PO TABS
20.0000 mg | ORAL_TABLET | Freq: Every day | ORAL | 0 refills | Status: DC
  Filled 2021-03-13: qty 30, 30d supply, fill #0

## 2021-03-13 MED ORDER — QUETIAPINE FUMARATE 200 MG PO TABS
200.0000 mg | ORAL_TABLET | Freq: Two times a day (BID) | ORAL | 0 refills | Status: DC
  Filled 2021-03-13: qty 60, 30d supply, fill #0

## 2021-03-13 MED ORDER — DIVALPROEX SODIUM 500 MG PO DR TAB
1000.0000 mg | DELAYED_RELEASE_TABLET | Freq: Every day | ORAL | 0 refills | Status: DC
  Filled 2021-03-13: qty 60, 30d supply, fill #0

## 2021-03-13 MED ORDER — GUANFACINE HCL 2 MG PO TABS
2.0000 mg | ORAL_TABLET | Freq: Every day | ORAL | 0 refills | Status: DC
  Filled 2021-03-13: qty 30, 30d supply, fill #0

## 2021-03-13 NOTE — ED Notes (Signed)
Brian Cardenas with [facility], gardian and social worker here, Pt's birth mother is here  -- Brian Cardenas and will be taking pt to her home.  Pt and Child psychotherapist agree to this.

## 2021-03-13 NOTE — Progress Notes (Addendum)
Patient is leaving today and going home with biological mother at 12:00 PM. E-mail below from Legal Guardian. Legal Guardian is APS listed in patients file. Below are APS's instructions.     Good afternoon,     I have completed the required documents for [facility] as previously discussed.     I spoke with Hale Bogus at [facility] who did an in-person screening last week. He stated that he would like another in-person meeting with hopes of [name] providing more insight and conversation instead of just shutting down and being unresponsive. He wants to make sure it will be a productive visit. If that is something that could happen, he would like to have everything wrapped up and placement completed by 5/23.  In the meantime, because we have seemingly run out of time, I have been instructed to meet Brian Cardenas's mother, Brian Cardenas at the hospital tomorrow at 12pm for her to pick him up as a respite resource until placement at the group homes we have been working with, or [facility] center is activated.

## 2021-03-13 NOTE — ED Provider Notes (Signed)
Emergency Medicine Observation Re-evaluation Note  Brian Cardenas is a 20 y.o. male, seen on rounds today. Pt initially presented to the ED for complaints of Agitation and Mental Health Problem, needs new group home placement. Currently, the patient has been accepted to [facility], awaiting bed. Per last social work note, Radiation protection practitioner, she states that pt is able to go home with biological mother today, patient is ward of state.   Physical Exam  BP 102/64 (BP Location: Left Arm)   Pulse 93   Temp 98.6 F (37 C) (Oral)   Resp 18   Ht 6\' 1"  (1.854 m)   Wt 119.7 kg   SpO2 96%   BMI 34.82 kg/m  Physical Exam General: alert, talking on the phone at nurses station Cardiac: normal rate Lungs: normal effort Psych: calm  ED Course / MDM  EKG:EKG Interpretation  Date/Time:  Tuesday February 06 2021 20:37:38 EDT Ventricular Rate:  150 PR Interval:  134 QRS Duration: 88 QT Interval:  262 QTC Calculation: 413 R Axis:   119 Text Interpretation: Sinus tachycardia Right axis deviation Abnormal ECG No old tracing to compare Confirmed by 06-04-1976 ([name]) on 02/07/2021 2:36:25 AM   I have reviewed the labs performed to date as well as medications administered while in observation.   Plan   Patient is ward of state.  403: Current plan is for patient to be discharged.  I have spent multiple hours speaking to legal guardian, 02/09/2021.  Please see Child psychotherapist, social worker note.  legal guardian who is an Adult Susa Simmonds listed in patient's file.  Her instructions are for patient to be  discharged home with biological mom as respite.  Patient states that he is comfortable being discharged home with biological mom, denies any abuse from mom.  I was able to meet mom in person, she states that she is comfortable with plan while they find placement for him.  Also spoke to legal guardian in person who states that they are comfortable with this plan currently.  Patient's home meds  have been ordered, was able to also speak to pharmacy about these.  Spoke to Dr. Management consultant, she agrees with the above plan.Pt to be discharged.     Clarene Duke, PA-C 03/13/21 1605    Little, 05/13/21, MD 03/16/21 [phone]

## 2021-03-13 NOTE — ED Notes (Signed)
Pt showering, after some encouragement by myself and the sitter, pt pleasant with no complaints

## 2021-03-13 NOTE — Progress Notes (Signed)
CSW received call from [facility] who requested a signed copy of MAR in order to ensure ongoing care at [facility]. CSW was able to print [facility] and have it signed by PA Allena Katz, per request.  CSW called guardian, Ms. Turner, to obtain verbal consent to fax list to Ms. Beck.

## 2021-03-13 NOTE — ED Notes (Signed)
Pt finished showering, linen was changed via sitter. Pt with no complaints

## 2021-03-14 ENCOUNTER — Other Ambulatory Visit (HOSPITAL_COMMUNITY): Payer: Self-pay

## 2021-04-10 ENCOUNTER — Other Ambulatory Visit (HOSPITAL_COMMUNITY): Payer: Self-pay

## 2021-05-01 ENCOUNTER — Other Ambulatory Visit (HOSPITAL_COMMUNITY): Payer: Self-pay

## 2021-05-01 ENCOUNTER — Other Ambulatory Visit (HOSPITAL_COMMUNITY): Payer: Self-pay | Admitting: Student

## 2021-05-02 ENCOUNTER — Other Ambulatory Visit (HOSPITAL_COMMUNITY): Payer: Self-pay

## 2021-05-03 ENCOUNTER — Other Ambulatory Visit (HOSPITAL_COMMUNITY): Payer: Self-pay

## 2021-05-04 ENCOUNTER — Other Ambulatory Visit (HOSPITAL_COMMUNITY): Payer: Self-pay

## 2021-05-09 ENCOUNTER — Other Ambulatory Visit (HOSPITAL_COMMUNITY): Payer: Self-pay

## 2021-07-30 ENCOUNTER — Ambulatory Visit: Payer: Medicaid Other | Admitting: Podiatry

## 2021-08-14 ENCOUNTER — Other Ambulatory Visit: Payer: Self-pay

## 2021-08-14 ENCOUNTER — Ambulatory Visit: Payer: Medicaid Other | Admitting: Podiatry

## 2021-08-14 ENCOUNTER — Ambulatory Visit (INDEPENDENT_AMBULATORY_CARE_PROVIDER_SITE_OTHER): Payer: Medicaid Other | Admitting: Podiatry

## 2021-08-14 DIAGNOSIS — L03032 Cellulitis of left toe: Secondary | ICD-10-CM | POA: Diagnosis not present

## 2021-08-14 NOTE — Patient Instructions (Signed)

## 2021-08-15 NOTE — Progress Notes (Signed)
  Subjective:  Patient ID: Isay Perleberg, male    DOB: Jan 21, 2001,  MRN: [id number]  Chief Complaint  Patient presents with   Ingrown Toenail    NP - INGROWN RIGHT GREAT TOE    20 y.o. male presents with the above complaint. History confirmed with patient.  Here with caretaker.  He has had chronic ingrowing toenails that he has tried to get out multiple times.  They continues to get infected.  Has had this for [place] significant amount of weeks.  Objective:  Physical Exam: warm, good capillary refill, no trophic changes or ulcerative lesions, normal DP and PT pulses, and normal sensory exam. Left Foot: Severe paronychia of the left hallux nail Assessment:   1. Paronychia of great toe, left      Plan:  Patient was evaluated and treated and all questions answered.  Due to the severity of the paronychia removal of the ingrown corners I think would not likely offer him much relief.  I recommended a total temporary nail avulsion to allow the nail to regrow.  Following sterile prep with Betadine and digital block I remove the left hallux nail plate with a Therapist, nutritional which he tolerated well and debrided of the overlying hypergranular tissue.  It was dressed and post care instructions were given.  I will see him back in 6 months for reevaluation and permanent partial nail avulsions if indicated.  Return in about 6 months (around 02/12/2022) for nail check.

## 2021-08-16 ENCOUNTER — Institutional Professional Consult (permissible substitution): Payer: Medicaid Other | Admitting: Pulmonary Disease

## 2021-09-05 ENCOUNTER — Ambulatory Visit (INDEPENDENT_AMBULATORY_CARE_PROVIDER_SITE_OTHER): Payer: Medicaid Other | Admitting: Pulmonary Disease

## 2021-09-05 ENCOUNTER — Encounter: Payer: Self-pay | Admitting: Pulmonary Disease

## 2021-09-05 ENCOUNTER — Other Ambulatory Visit: Payer: Self-pay

## 2021-09-05 VITALS — BP 110/70 | HR 78 | Temp 98.1°F | Ht 73.0 in | Wt 245.0 lb

## 2021-09-05 DIAGNOSIS — G4733 Obstructive sleep apnea (adult) (pediatric): Secondary | ICD-10-CM | POA: Diagnosis not present

## 2021-09-05 NOTE — Patient Instructions (Addendum)
Moderate probability of significant obstructive sleep apnea  We will schedule you for a sleep study  We will let you know what the study shows as soon as we reviewed the study  Continue weight loss efforts  Call with significant concerns  Follow-up in about 3 months

## 2021-09-05 NOTE — Progress Notes (Signed)
Brian Cardenas    [id number]    09-20-2001  Primary Care Physician:Patient, No Pcp Per (Inactive)  Referring Physician: Jackie Plum, MD [id number] [place],  [place] [address]  Chief complaint:   History of witnessed apneas  HPI:  Questionable history of snoring, witnessed apneas Patient states that friends of told him that he looks like he is not breathing during his sleep  At some point was on usual dose of Depakote which was causing sleepiness -This has been optimized  Usually goes to bed about 10 PM, wakes up finally about 6 AM Sometimes has difficulty sleeping  Wakes up a few times during the night  How he feels in the morning depends on how many hours of sleep he is able to get at night   Multiple family members with obstructive sleep apnea   does not smoke cigarettes  History of behavioral issues for which she was started on Depakote  History of autism, bipolar disorder  Outpatient Encounter Medications as of 09/05/2021  Medication Sig   atorvastatin (LIPITOR) 20 MG tablet Take 20 mg by mouth daily.   divalproex (DEPAKOTE) 250 MG DR tablet Take 250 mg by mouth 3 (three) times daily. Takes 1 tablet in the morning, and 3 in the evening.   fenofibrate 54 MG tablet Take 54 mg by mouth daily.   hydrochlorothiazide (HYDRODIURIL) 25 MG tablet Take 25 mg by mouth daily.   levothyroxine (SYNTHROID) 50 MCG tablet Take 50 mcg by mouth daily.   [DISCONTINUED] chlorhexidine (PERIDEX) 0.12 % solution Rinse mouth with 1 capful for 30 seconds two times a day after brushing; expectorate after rinsing, do not swallow. (Patient not taking: Reported on 09/05/2021)   [DISCONTINUED] divalproex (DEPAKOTE) 250 MG DR tablet Take 3 tablets (750 mg total) by mouth 2 (two) times daily.   [DISCONTINUED] divalproex (DEPAKOTE) 500 MG DR tablet Take 2 tablets (1,000 mg total) by mouth at bedtime.   [DISCONTINUED] guanFACINE (TENEX) 2 MG tablet Take 1 tablet (2 mg  total) by mouth daily.   [DISCONTINUED] hydrochlorothiazide (HYDRODIURIL) 12.5 MG tablet Take 1 tablet (12.5 mg total) by mouth daily.   [DISCONTINUED] QUEtiapine (SEROQUEL) 200 MG tablet Take 1 tablet (200 mg total) by mouth 2 (two) times daily.   [DISCONTINUED] rosuvastatin (CRESTOR) 20 MG tablet Take 1 tablet (20 mg total) by mouth daily.   No facility-administered encounter medications on file as of 09/05/2021.    Allergies as of 09/05/2021   (No Known Allergies)    Past Medical History:  Diagnosis Date   Autism    Bipolar 1 disorder (HCC)    Conduct disorder     No past surgical history on file.  No family history on file.  Social History   Socioeconomic History   Marital status: Single    Spouse name: Not on file   Number of children: Not on file   Years of education: Not on file   Highest education level: Not on file  Occupational History   Not on file  Tobacco Use   Smoking status: Never   Smokeless tobacco: Never  Substance and Sexual Activity   Alcohol use: Never   Drug use: Never   Sexual activity: Not on file  Other Topics Concern   Not on file  Social History Narrative   Not on file   Social Determinants of Health   Financial Resource Strain: Not on file  Food Insecurity: Not on file  Transportation Needs: Not on file  Physical Activity: Not on file  Stress: Not on file  Social Connections: Not on file  Intimate Partner Violence: Not on file    Review of Systems  Constitutional:  Positive for fatigue.  Psychiatric/Behavioral:  Positive for sleep disturbance.    Vitals:   09/05/21 1609  BP: 110/70  Pulse: 78  Temp: 98.1 F (36.7 C)  SpO2: 98%     Physical Exam Constitutional:      Appearance: He is obese.  HENT:     Head: Normocephalic.     Nose: Nose normal.     Mouth/Throat:     Mouth: Mucous membranes are moist.     Comments: Mallampati 3, macroglossia Eyes:     Pupils: Pupils are equal, round, and reactive to light.   Cardiovascular:     Rate and Rhythm: Normal rate and regular rhythm.     Heart sounds: No murmur heard.   No friction rub.  Pulmonary:     Effort: No respiratory distress.     Breath sounds: No stridor. No wheezing or rhonchi.  Musculoskeletal:     Cervical back: No rigidity or tenderness.  Neurological:     Mental Status: He is alert.  Psychiatric:        Mood and Affect: Mood normal.   Results of the Epworth flowsheet 09/05/2021  Sitting and reading 0  Watching TV 0  Sitting, inactive in a public place (e.g. a theatre or a meeting) 2  As a passenger in a car for an hour without a break 2  Lying down to rest in the afternoon when circumstances permit 1  Sitting and talking to someone 0  Sitting quietly after a lunch without alcohol 0  In a car, while stopped for a few minutes in traffic 1  Total score 6   Data Reviewed: No previous study available  Assessment:  Moderate to high probability of significant obstructive sleep apnea  Will likely not be able to perform a home sleep study  Noncontinuous, disrupted sleep will make it difficult to obtain an adequate home sleep study  Pathophysiology of sleep disordered breathing discussed with the patient Treatment options for sleep disordered breathing discussed  Medications that may contribute to daytime sleepiness/fatigue also reviewed  Plan/Recommendations: We will schedule the patient for an in lab split-night study  Weight loss efforts encouraged  Behavioral modifications to optimize sleep hygiene discussed  Tentative follow-up in 3 months  Encouraged to call with significant concerns   Virl Diamond MD [facility] 09/05/2021, 4:34 PM  CC: Jackie Plum, MD

## 2021-09-06 ENCOUNTER — Encounter: Payer: Self-pay | Admitting: Pulmonary Disease

## 2021-09-20 ENCOUNTER — Ambulatory Visit (HOSPITAL_BASED_OUTPATIENT_CLINIC_OR_DEPARTMENT_OTHER): Payer: Medicaid Other | Admitting: Pulmonary Disease

## 2021-10-31 ENCOUNTER — Other Ambulatory Visit: Payer: Self-pay

## 2021-10-31 ENCOUNTER — Ambulatory Visit (HOSPITAL_BASED_OUTPATIENT_CLINIC_OR_DEPARTMENT_OTHER): Payer: Medicaid Other | Attending: Pulmonary Disease | Admitting: Pulmonary Disease

## 2021-10-31 DIAGNOSIS — G4733 Obstructive sleep apnea (adult) (pediatric): Secondary | ICD-10-CM

## 2021-10-31 DIAGNOSIS — E669 Obesity, unspecified: Secondary | ICD-10-CM | POA: Diagnosis not present

## 2021-10-31 DIAGNOSIS — R0683 Snoring: Secondary | ICD-10-CM | POA: Insufficient documentation

## 2021-10-31 DIAGNOSIS — I10 Essential (primary) hypertension: Secondary | ICD-10-CM | POA: Diagnosis not present

## 2021-10-31 DIAGNOSIS — Z6831 Body mass index (BMI) 31.0-31.9, adult: Secondary | ICD-10-CM | POA: Diagnosis not present

## 2021-11-05 ENCOUNTER — Telehealth: Payer: Self-pay | Admitting: Pulmonary Disease

## 2021-11-05 DIAGNOSIS — G4733 Obstructive sleep apnea (adult) (pediatric): Secondary | ICD-10-CM

## 2021-11-05 NOTE — Procedures (Signed)
POLYSOMNOGRAPHY  Last, First: Brian, Cardenas MRN: [id number] Gender: Male Age (years): 20 Weight (lbs): 234 DOB: December 04, 2000 BMI: 31 Primary Care: No PCP Epworth Score: 12 Referring: Tomma Lightning MD Technician: Ulyess Mort Interpreting: Tomma Lightning MD Study Type: Split Night CPAP Ordered Study Type: Split Night CPAP Study date: 10/31/2021 Location: [place] CLINICAL INFORMATION Brian Cardenas is a 20 year old Male and was referred to the sleep center for evaluation of G47.33 OSA: Adult and Pediatric (327.23). Indications include Hypertension, Obesity, Snoring.  MEDICATIONS Patient self administered medications include: MELATONIN, GUANFACINE, DIVALPROEX SODIUM, quetinpine. Medications administered during study include No sleep medicine administered.  SLEEP STUDY TECHNIQUE The patient underwent an attended overnight level one polysomnography titration to assess the effects of CPAP therapy. The following variables were monitored: EEG (C4-A1, C3-A2, O1-A2, O2-A1), EOG, submental and leg EMG, ECG, oxyhemoglobin saturation by pulse oximetry, thoracic and abdominal respiratory effort belts, nasal/oral airflow by pressure sensor, body position sensor and snoring sensor. CPAP pressure was titrated to eliminate apneas, hypopneas and oxygen desaturation. Hypopneas were scored per AASM definition IB (4% desaturation)  The NPSG portion of the study ended at 12:43:19 AM . The CPAP titration was initiated at 12:46:37 AM AM with the CPAP portion of the study ending at 4:49:48 AM.  TECHNICIAN COMMENTS Comments added by Technician: PATIENT WAS ORDERED AS A SPLIT NIGHT STUDY. Comments added by Scorer: N/A SLEEP ARCHITECTURE The recording time for the entire night was 378.8 minutes. The diagnostic portion was initiated at 10:31:01 PM and terminated at 12:43:19 AM. The time in bed was 132.3 minutes. EEG confirmed total sleep time was 131.7 minutes yielding a sleep efficiency of  99.6%%. Sleep onset after lights out was 0.1 minutes with a REM latency of 72.5 minutes. The patient spent 6.1%% of the night in stage N1 sleep, 89.4%% in stage N2 sleep, 0.0%% in stage N3 and 4.6% in REM. The Arousal Index was 43.3/hour.  The titration portion was initiated at 12:46:37 AM and terminated at 4:49:48 AM. The time in bed was 243.2 minutes. EEG confirmed total sleep time was 238.7 minutes yielding a sleep efficiency of 98.2%%. Sleep onset after CPAP initiation was 2.5 minutes with a REM latency of 66.0 minutes. The patient spent 4.8%% of the night in stage N1 sleep, 78.2%% in stage N2 sleep, 2.5%% in stage N3 and 14.5% in REM. The Arousal Index was 5.3/hour. RESPIRATORY PARAMETERS During the diagnostic portion, there were a total of 289 respiratory disturbances recorded; 95 apneas ( 78 obstructive, 6 mixed, 11 central), 179 hypopneas and 15 RERAs. The apnea/hypopnea index 124.8 was events/hour and the RDI was 131.6 events/hour. The central sleep apnea index was 5.0 events/hour. The REM AHI was 0.0 /h and NREM AHI was 130.8/h. The REM RDI was 50.0 /h and NREM RDI was 135.5 /h. The supine AHI was 124.8/h, and the non supine AHI was 0/h; supine during 100.0%% of sleep. The supine RDI was 131.6/h, and the non supine RDI was N/A/h. Respiratory disturbances were associated with oxygen desaturation down to a nadir of 84.0 % during sleep. The mean oxygen saturation during the study was 93.5%. The cumulative time under 88% oxygen saturation was 3.7 minutes.  During the titration portion, the apnea/hypopnea index (AHI) was 1.0 events/hour and the RDI was 1.5 events/hour. The central sleep apnea index was events/hour. The most appropriate setting of CPAP was IPAP/EPAP 12/12 cm H2O. At this setting, the sleep efficiency was 97% and the patient was supine for 40%. The AHI was 0  events per hour(with 0 central events). Oxygen nadir was 93.0. LEG MOVEMENT DATA The periodic limb movement index was 0.0/hour with  an associated arousal index of /hour. CARDIAC DATA The underlying cardiac rhythm was most consistent with sinus rhythm. Mean heart rate was 70.9 during diagnostic portion and 65.9 during titration portion of study. Additional rhythm abnormalities include None.  IMPRESSIONS Severe Obstructive Sleep apnea(OSA) Optimal pressure attained. EKG showed no cardiac abnormalities. No Significant Central Sleep Apnea (CSA) Moderate Oxygen Desaturation The patient snored with moderate snoring volume. EEG did not show alpha intrusion. No significant periodic leg movements(PLMs) during sleep. However, no significant associated arousals. Normal sleep efficiency, short primary sleep latency, short REM sleep latency and no slow wave latency.  DIAGNOSIS Obstructive Sleep Apnea (G47.33)  RECOMMENDATIONS Trial of CPAP therapy on 12 cm H2O with a Medium Wide size Philips Respironics Full Face Mask Dreamwear mask and heated humidification. Avoid alcohol, sedatives and other CNS depressants that may worsen sleep apnea and disrupt normal sleep architecture. Sleep hygiene should be reviewed to assess factors that may improve sleep quality. Weight management and regular exercise should be initiated or continued. Return to Sleep Center for re-evaluation after 4 weeks of therapy  [Electronically signed] 11/05/2021 04:38 PM  Virl Diamond MD NPI: [id number]

## 2021-11-05 NOTE — Telephone Encounter (Signed)
Call patient  Sleep study result  Date of study: 10/31/2021  Impression: Severe obstructive sleep apnea  Recommendation: DME referral  Recommend CPAP therapy for severe obstructive sleep apnea  Trial of CPAP therapy on 12 cm H2O with a Medium Wide size Philips Respironics Full Face Mask Dreamwear mask and heated humidification.  Encourage weight loss measures  Follow-up in the office 4 to 6 weeks following initiation of treatment

## 2021-11-06 NOTE — Telephone Encounter (Signed)
I called and left a VM for the patient or caregiver to call back. Waiting on a call back.

## 2021-11-07 NOTE — Telephone Encounter (Signed)
I have called and spoke with the caregiver for the patient and she is agreeable to the recommendations and wants the office to place an order.    She is aware to call the office with an appointment once he has had the machine 4-6 weeks and verbalized understanding.

## 2021-11-16 ENCOUNTER — Ambulatory Visit (HOSPITAL_COMMUNITY)
Admission: EM | Admit: 2021-11-16 | Discharge: 2021-11-17 | Disposition: A | Discharge status: Home / Self Care | Payer: Medicaid Other | Attending: Urology | Admitting: Urology

## 2021-11-16 DIAGNOSIS — F319 Bipolar disorder, unspecified: Secondary | ICD-10-CM | POA: Insufficient documentation

## 2021-11-16 DIAGNOSIS — Z9151 Personal history of suicidal behavior: Secondary | ICD-10-CM | POA: Insufficient documentation

## 2021-11-16 DIAGNOSIS — F4323 Adjustment disorder with mixed anxiety and depressed mood: Secondary | ICD-10-CM | POA: Insufficient documentation

## 2021-11-16 DIAGNOSIS — F840 Autistic disorder: Secondary | ICD-10-CM | POA: Insufficient documentation

## 2021-11-16 DIAGNOSIS — Z20822 Contact with and (suspected) exposure to covid-19: Secondary | ICD-10-CM | POA: Insufficient documentation

## 2021-11-16 DIAGNOSIS — F3130 Bipolar disorder, current episode depressed, mild or moderate severity, unspecified: Secondary | ICD-10-CM

## 2021-11-16 DIAGNOSIS — R45851 Suicidal ideations: Secondary | ICD-10-CM | POA: Insufficient documentation

## 2021-11-16 LAB — POC SARS CORONAVIRUS 2 AG: SARSCOV2ONAVIRUS 2 AG: NEGATIVE

## 2021-11-16 LAB — POCT URINE DRUG SCREEN - MANUAL ENTRY (I-SCREEN)
POC Amphetamine UR: NOT DETECTED
POC Buprenorphine (BUP): NOT DETECTED
POC Cocaine UR: NOT DETECTED
POC Marijuana UR: NOT DETECTED
POC Methadone UR: NOT DETECTED
POC Methamphetamine UR: NOT DETECTED
POC Morphine: NOT DETECTED
POC Oxazepam (BZO): NOT DETECTED
POC Oxycodone UR: NOT DETECTED
POC Secobarbital (BAR): NOT DETECTED

## 2021-11-16 LAB — POC SARS CORONAVIRUS 2 AG -  ED: SARS Coronavirus 2 Ag: NEGATIVE

## 2021-11-16 MED ORDER — HYDROXYZINE HCL 25 MG PO TABS
25.0000 mg | ORAL_TABLET | Freq: Three times a day (TID) | ORAL | Status: DC | PRN
  Administered 2021-11-16: 25 mg via ORAL
  Filled 2021-11-16: qty 1

## 2021-11-16 MED ORDER — HYDROCHLOROTHIAZIDE 25 MG PO TABS
25.0000 mg | ORAL_TABLET | Freq: Every day | ORAL | Status: DC
  Administered 2021-11-17: 25 mg via ORAL
  Filled 2021-11-16: qty 1

## 2021-11-16 MED ORDER — GUANFACINE HCL 1 MG PO TABS
2.0000 mg | ORAL_TABLET | Freq: Every day | ORAL | Status: DC
  Administered 2021-11-16: 2 mg via ORAL
  Filled 2021-11-16: qty 2

## 2021-11-16 MED ORDER — QUETIAPINE FUMARATE 200 MG PO TABS
200.0000 mg | ORAL_TABLET | Freq: Two times a day (BID) | ORAL | Status: DC
  Administered 2021-11-16 – 2021-11-17 (×2): 200 mg via ORAL
  Filled 2021-11-16 (×2): qty 1

## 2021-11-16 MED ORDER — TRAZODONE HCL 50 MG PO TABS: 50.0000 mg | ORAL_TABLET | Freq: Every evening | ORAL | Status: DC | PRN

## 2021-11-16 MED ORDER — ALUM & MAG HYDROXIDE-SIMETH 200-200-20 MG/5ML PO SUSP: 30.0000 mL | ORAL | Status: DC | PRN

## 2021-11-16 MED ORDER — LEVOTHYROXINE SODIUM 25 MCG PO TABS
50.0000 ug | ORAL_TABLET | Freq: Every day | ORAL | Status: DC
  Administered 2021-11-17: 50 ug via ORAL
  Filled 2021-11-16: qty 2

## 2021-11-16 MED ORDER — ACETAMINOPHEN 325 MG PO TABS: 650.0000 mg | ORAL_TABLET | Freq: Four times a day (QID) | ORAL | Status: DC | PRN

## 2021-11-16 MED ORDER — ATORVASTATIN CALCIUM 10 MG PO TABS
20.0000 mg | ORAL_TABLET | Freq: Every day | ORAL | Status: DC
  Administered 2021-11-17: 20 mg via ORAL
  Filled 2021-11-16: qty 2

## 2021-11-16 MED ORDER — MAGNESIUM HYDROXIDE 400 MG/5ML PO SUSP: 30.0000 mL | Freq: Every day | ORAL | Status: DC | PRN
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
## 2021-11-16 NOTE — ED Notes (Signed)
Pt A&O x 4, from Group home, presents with suicidal ideations, plan to walk on train tracks or stab himself in the chest.  Pt reports ex-girlfriend broke up with him for his friend.  Pt anxious and cooperative.  No distress noted.  Monitoring for safety.

## 2021-11-16 NOTE — ED Notes (Signed)
2 RN's attempted blood draw, unsuccessful.  Will try again after pt pushes fluids.

## 2021-11-17 DIAGNOSIS — F4323 Adjustment disorder with mixed anxiety and depressed mood: Secondary | ICD-10-CM | POA: Diagnosis not present

## 2021-11-17 DIAGNOSIS — Z20822 Contact with and (suspected) exposure to covid-19: Secondary | ICD-10-CM | POA: Diagnosis not present

## 2021-11-17 DIAGNOSIS — R45851 Suicidal ideations: Secondary | ICD-10-CM | POA: Diagnosis not present

## 2021-11-17 DIAGNOSIS — F319 Bipolar disorder, unspecified: Secondary | ICD-10-CM | POA: Diagnosis not present

## 2021-11-17 LAB — RESP PANEL BY RT-PCR (FLU A&B, COVID) ARPGX2
Influenza A by PCR: NEGATIVE
Influenza B by PCR: NEGATIVE
SARS Coronavirus 2 by RT PCR: NEGATIVE
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
## 2021-11-17 NOTE — ED Notes (Signed)
Discharge instructions provided and Pt stated understanding. Pt alert, orient and ambulatory prior to d/c from facility. Personal belongings returned from locker number 17. Escorted Pt to the front lobby to meet group home staff member. Safety maintained.

## 2021-11-17 NOTE — ED Notes (Signed)
Pt sitting up in the pull out bed conversing with another Pt. Safety maintained and will continue to monitor.

## 2021-11-17 NOTE — Discharge Instructions (Signed)
Take all medications as prescribed. Keep all follow-up appointments as scheduled.  Do not consume alcohol or use illegal drugs while on prescription medications. Report any adverse effects from your medications to your primary care provider promptly.  In the event of recurrent symptoms or worsening symptoms, call 911, a crisis hotline, or go to the nearest emergency department for evaluation.   

## 2021-11-17 NOTE — ED Provider Notes (Addendum)
FBC/OBS ASAP Discharge Summary  Date and Time: 11/17/2021 10:52 AM  Name: Brian Cardenas  MRN:  [id number]   Discharge Diagnoses:  Final diagnoses:  Adjustment disorder with mixed anxiety and depressed mood  Suicidal ideation  Bipolar I disorder, most recent episode depressed ([place])    Subjective: Gust hot 21 year old Caucasian male presents due to suicidal ideations.  States that had a plan to sitting on the train track.  Patient is adamantly denying that he suicidal today.  States " I do not know what I was thinking."  Reports feeling sad and frustrated related to this most recent break-up.  Reports history of self injures behaviors however he does not plan or intent currently.  Matsuo reports " I am just going to play my game ( xbox) and not be worried about her."   Adolf has a charted diagnosis with bipolar disorder, autism, conduct disorder and history of this mood disorder.  NP spoke to [place] regarding safety concerns with patient discharging.  She denied any concerns at this time.    States " I am going to suggest that he is no longer attending the day program. [facility])."  She reports patient had not had any behavior issues since he has been under her care for the past 7 months and stated since the last 4 months while attending [facility] group patient has been "acting out more and more  and is  manipulating the group leader."   Peter Congo  states she has been unable to find additional outpatient resources for him to follow-up with.  We will make additional outpatient resources available.  Patient to discharge to [facility] care.  States she will be to pick up patient after she has been since grandmother's house in [place].  Support, encouragement and reassurance was provided.  Stay Summary:  per admission assessment note: "Patient says that he has been feeling depressed and suicidal over the past 2 weeks.  He reports that his girlfriend recently broke up  with him and started dating another peer at his day program. He identify recent break-up and argument with [facility] staff, Lucilla Lame as his main triggers.  Patient says that he is experiencing "emotional distress" and that he wants to end his life.  Patient states that he went to a train track today with plan to jump in front of a moving train to commit suicide.  Patient said that he didn't follow through with his plan to jump in front of the train but instead watch the train go by and cry. He said he then left the train track in  laid in someone's yard and police were called and he was brought to [facility]."   Total Time spent with patient: 15 minutes  Past Psychiatric History:  Past Medical History:  Past Medical History:  Diagnosis Date   Autism    Bipolar 1 disorder ([place])    Conduct disorder    No past surgical history on file. Family History: No family history on file. Family Psychiatric History:  Social History:  Social History   Substance and Sexual Activity  Alcohol Use Never     Social History   Substance and Sexual Activity  Drug Use Never    Social History   Socioeconomic History   Marital status: Single    Spouse name: Not on file   Number of children: Not on file   Years of education: Not on file   Highest education level: Not on file  Occupational History  Not on file  Tobacco Use   Smoking status: Never   Smokeless tobacco: Never  Substance and Sexual Activity   Alcohol use: Never   Drug use: Never   Sexual activity: Not on file  Other Topics Concern   Not on file  Social History Narrative   Not on file   Social Determinants of Health   Financial Resource Strain: Not on file  Food Insecurity: Not on file  Transportation Needs: Not on file  Physical Activity: Not on file  Stress: Not on file  Social Connections: Not on file   SDOH:  SDOH Screenings   Alcohol Screen: Not on file  Depression (PHQ2-9): Not on file  Financial Resource Strain: Not on  file  Food Insecurity: Not on file  Housing: Not on file  Physical Activity: Not on file  Social Connections: Not on file  Stress: Not on file  Tobacco Use: Low Risk    Smoking Tobacco Use: Never   Smokeless Tobacco Use: Never   Passive Exposure: Not on file  Transportation Needs: Not on file    Tobacco Cessation:  N/A, patient does not currently use tobacco products  Current Medications:  Current Facility-Administered Medications  Medication Dose Route Frequency Provider Last Rate Last Admin   acetaminophen (TYLENOL) tablet 650 mg  650 mg Oral Q6H PRN Ajibola, Ene A, NP       alum & mag hydroxide-simeth (MAALOX/MYLANTA) 200-200-20 MG/5ML suspension 30 mL  30 mL Oral Q4H PRN Ajibola, Ene A, NP       atorvastatin (LIPITOR) tablet 20 mg  20 mg Oral Daily Ajibola, Ene A, NP   20 mg at 11/17/21 0903   guanFACINE (TENEX) tablet 2 mg  2 mg Oral QHS Ajibola, Ene A, NP   2 mg at 11/16/21 2333   hydrochlorothiazide (HYDRODIURIL) tablet 25 mg  25 mg Oral Daily Ajibola, Ene A, NP   25 mg at 11/17/21 [id number]   hydrOXYzine (ATARAX) tablet 25 mg  25 mg Oral TID PRN Ajibola, Ene A, NP   25 mg at 11/16/21 2333   levothyroxine (SYNTHROID) tablet 50 mcg  50 mcg Oral Daily Ajibola, Ene A, NP   50 mcg at 11/17/21 0558   magnesium hydroxide (MILK OF MAGNESIA) suspension 30 mL  30 mL Oral Daily PRN Ajibola, Ene A, NP       QUEtiapine (SEROQUEL) tablet 200 mg  200 mg Oral BID Ajibola, Ene A, NP   200 mg at 11/17/21 [id number]   traZODone (DESYREL) tablet 50 mg  50 mg Oral QHS PRN Ajibola, Ene A, NP       Current Outpatient Medications  Medication Sig Dispense Refill   fenofibrate (TRICOR) 145 MG tablet 1 tab(s)     hydrochlorothiazide (HYDRODIURIL) 25 MG tablet 1 tab(s)     Melatonin 5 MG CAPS 2 cap(s)     QUEtiapine (SEROQUEL) 200 MG tablet Take 200 mg by mouth 2 (two) times daily.     atorvastatin (LIPITOR) 20 MG tablet Take 20 mg by mouth daily.     divalproex (DEPAKOTE) 250 MG DR tablet Take 250 mg by mouth  3 (three) times daily. Takes 1 tablet in the morning, and 3 in the evening.     divalproex (DEPAKOTE) 250 MG DR tablet 3 tab(s)     divalproex (DEPAKOTE) 500 MG DR tablet Take 500 mg by mouth 2 (two) times daily.     fenofibrate 54 MG tablet Take 54 mg by mouth daily.  guanFACINE (TENEX) 2 MG tablet 1 tab(s)     hydrochlorothiazide (HYDRODIURIL) 25 MG tablet Take 25 mg by mouth daily.     levothyroxine (SYNTHROID) 50 MCG tablet Take 50 mcg by mouth daily.      PTA Medications: (Not in a hospital admission)   Musculoskeletal  Strength & Muscle Tone: within normal limits Gait & Station: normal Patient leans: N/A  Psychiatric Specialty Exam  Presentation  General Appearance: Appropriate for Environment  Eye Contact:Good  Speech:Clear and Coherent  Speech Volume:Normal  Handedness:Right   Mood and Affect  Mood:Anxious; Depressed  Affect:Congruent   Thought Process  Thought Processes:Coherent  Descriptions of Associations:Intact  Orientation:Full (Time, Place and Person)  Thought Content:WDL  Diagnosis of Schizophrenia or Schizoaffective disorder in past: No    Hallucinations:Hallucinations: None  Ideas of Reference:None  Suicidal Thoughts:Suicidal Thoughts: Yes, Active SI Active Intent and/or Plan: With Plan  Homicidal Thoughts:Homicidal Thoughts: Yes, Passive   Sensorium  Memory:Immediate Good; Recent Fair; Remote Fair  Judgment:Poor  Insight:Fair   Executive Functions  Concentration:Fair  Attention Span:Fair  [place]   Psychomotor Activity  Psychomotor Activity:Psychomotor Activity: Normal   Assets  Assets:Communication Skills; Desire for Improvement; Housing; Physical Health; Financial Resources/Insurance; Vocational/Educational   Sleep  Sleep:Sleep: Good Number of Hours of Sleep: 8   Nutritional Assessment (For OBS and FBC admissions only) Has the patient had a weight loss or gain  of 10 pounds or more in the last 3 months?: No Has the patient recently lost weight without trying?: 0 Has the patient been eating poorly because of a decreased appetite?: 0 Malnutrition Screening Tool Score: 0    Physical Exam  Physical Exam Vitals reviewed.  Neurological:     Mental Status: He is oriented to person, place, and time.   Review of Systems  Constitutional: Negative.   HENT: Negative.    Gastrointestinal: Negative.   Genitourinary: Negative.   Skin: Negative.   Neurological: Negative.   Psychiatric/Behavioral:  Positive for depression. Negative for suicidal ideas. The patient is not nervous/anxious.   All other systems reviewed and are negative. Blood pressure 132/81, pulse 62, temperature 97.8 F (36.6 C), temperature source Oral, resp. rate 16, SpO2 99 %. There is no height or weight on file to calculate BMI.  Demographic Factors:  Male and Adolescent or young adult  Loss Factors: Decrease in vocational status and NA  Historical Factors: NA  Risk Reduction Factors:   Positive social support, Positive therapeutic relationship, and Positive coping skills or problem solving skills  Continued Clinical Symptoms:  Depression:   Impulsivity  Cognitive Features That Contribute To Risk:  Closed-mindedness    Suicide Risk:  Minimal: No identifiable suicidal ideation.  Patients presenting with no risk factors but with morbid ruminations; may be classified as minimal risk based on the severity of the depressive symptoms  Plan Of Care/Follow-up recommendations:  Activity:  as tolerated Diet:  heart healthy   Disposition: Take all medications as prescribed. Keep all follow-up appointments as scheduled.  Do not consume alcohol or use illegal drugs while on prescription medications. Report any adverse effects from your medications to your primary care provider promptly.  In the event of recurrent symptoms or worsening symptoms, call 911, a crisis hotline, or go  to the nearest emergency department for evaluation.    Derrill Center, NP 11/17/2021, 10:52 AM

## 2021-11-17 NOTE — ED Notes (Signed)
Pt sleeping at present, no distress noted.  Monitoring for safety. 

## 2021-11-17 NOTE — ED Notes (Signed)
Malachi Bonds, from the group home, stated that she will be here at 1700 to pick up Pt. Stated she is the way to meet the grandmother now.
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
## 2021-11-27 ENCOUNTER — Telehealth (HOSPITAL_COMMUNITY): Payer: Self-pay | Admitting: Emergency Medicine

## 2021-11-27 NOTE — BH Assessment (Signed)
Care Management - FBC Follow Up Discharges   Writer attempted to make contact with patient today and was unsuccessful.  Writer left a HIPPA compliant voice message.   Per chart review, patient was provided with outpatient resources.

## 2021-12-17 NOTE — Telephone Encounter (Signed)
[place], [address] [place], [place], [place]; Laurin Coder, MD; Emilia Beck afternoon,   Just letting you know that we have made multiple attempts to reach this patient to schedule CPAP set up.  To date, we've not been able to make contact.  He does have a very high AHI.   If you are able to reach him, please have him call [phone] ext. [phone] to schedule.   Thank you!  Melissa   This was sent from [facility] and I have called the caregiver to let her know to  be on the lookout and she is aware.

## 2022-02-14 ENCOUNTER — Ambulatory Visit (INDEPENDENT_AMBULATORY_CARE_PROVIDER_SITE_OTHER): Payer: Medicaid Other | Admitting: Podiatry

## 2022-02-14 DIAGNOSIS — L600 Ingrowing nail: Secondary | ICD-10-CM

## 2022-02-14 MED ORDER — NEOMYCIN-POLYMYXIN-HC 1 % OT SOLN: OTIC | 0 refills | Status: AC

## 2022-02-14 NOTE — Patient Instructions (Signed)

## 2022-02-17 NOTE — Progress Notes (Signed)
?  Subjective:  ?Patient ID: Brian Cardenas, male    DOB: 12/12/2000,  MRN: [id number] ? ?Chief Complaint  ?Patient presents with  ? Nail Problem  ?  Return in about 6 months (around 02/12/2022) for nail check.  ? ? ?21 y.o. male presents with the above complaint. History confirmed with patient.  Here today for follow-up after nail removal started callus pain again infected. ? ?Objective:  ?Physical Exam: ?warm, good capillary refill, no trophic changes or ulcerative lesions, normal DP and PT pulses, normal sensory exam, and ingrowing left hallux nail ?Assessment:  ? ?1. Ingrowing left great toenail   ? ? ? ?Plan:  ?Patient was evaluated and treated and all questions answered. ? ? ? ?Ingrown Nail, left ?-Patient elects to proceed with minor surgery to remove ingrown toenail today. Consent reviewed and signed by patient. ?-Ingrown nail excised. See procedure note. ?-Educated on post-procedure care including soaking. Written instructions provided and reviewed. ?-Patient to follow up in 2 weeks for nail check. ? ?Procedure: Excision of Ingrown Toenail ?Location: Left 1st toe  medial and lateral  nail borders. ?Anesthesia: Lidocaine 1% plain; 1.5 mL and Marcaine 0.5% plain; 1.5 mL, digital block. ?Skin Prep: Betadine. ?Dressing: Silvadene; telfa; dry, sterile, compression dressing. ?Technique: Following skin prep, the toe was exsanguinated and a tourniquet was secured at the base of the toe. The affected nail border was freed, split with a nail splitter, and excised. Chemical matrixectomy was then performed with phenol and irrigated out with alcohol. The tourniquet was then removed and sterile dressing applied. ?Disposition: Patient tolerated procedure well. Patient to return in 2 weeks for follow-up.  ? ? ?Return in about 3 weeks (around 03/07/2022) for nail check.  ? ?

## 2022-03-14 ENCOUNTER — Ambulatory Visit (INDEPENDENT_AMBULATORY_CARE_PROVIDER_SITE_OTHER): Payer: Medicaid Other | Admitting: Podiatry

## 2022-03-14 DIAGNOSIS — L600 Ingrowing nail: Secondary | ICD-10-CM | POA: Diagnosis not present

## 2022-03-19 ENCOUNTER — Encounter: Payer: Self-pay | Admitting: Podiatry

## 2022-03-19 NOTE — Progress Notes (Signed)
?  Subjective:  ?Patient ID: Brian Cardenas, male    DOB: 03-Jun-2001,  MRN: [id number] ? ?Chief Complaint  ?Patient presents with  ? Follow-up  ?  3 weeks (around 03/07/2022) for nail check to left great toe. Patient had minor surgery for ingrown toe nail removal on 02/14/22- nail completely off- bumped left great toe with tv stand. Denies any bleeding or pus. Tender to touch. Pt is not soaking- only cleaning toe with water.   ? ? ?21 y.o. male presents with the above complaint. History confirmed with patient.  Doing well lost the remainder of the nail ? ?Objective:  ?Physical Exam: ?warm, good capillary refill, no trophic changes or ulcerative lesions, normal DP and PT pulses, normal sensory exam, and matricectomy site is healing well, there is proximal attachment of the remainder of the nail but it did split in the midpoint ?Assessment:  ? ?1. Ingrowing left great toenail   ? ? ? ?Plan:  ?Patient was evaluated and treated and all questions answered. ? ? ? ?Ingrown Nail, left ?-Ingrown nail is healing well he did lose the remainder of the nail but I expect this will continue to grow the proximal portion of the remaining nail is still well attached he can continue to let this grow out on its own.  Return as needed. ? ? ?Return if symptoms worsen or fail to improve.  ? ?

## 2022-08-25 IMAGING — CR DG HAND COMPLETE 3+V*R*
3 series · 3 of 3 positions shown · non-contrast
Comparison: Right hand radiograph dated 06/24/2016.

CLINICAL DATA: 19-year-old male with right hand laceration and
pain.

EXAM:
RIGHT HAND - COMPLETE 3+ VIEW

[hand pa]
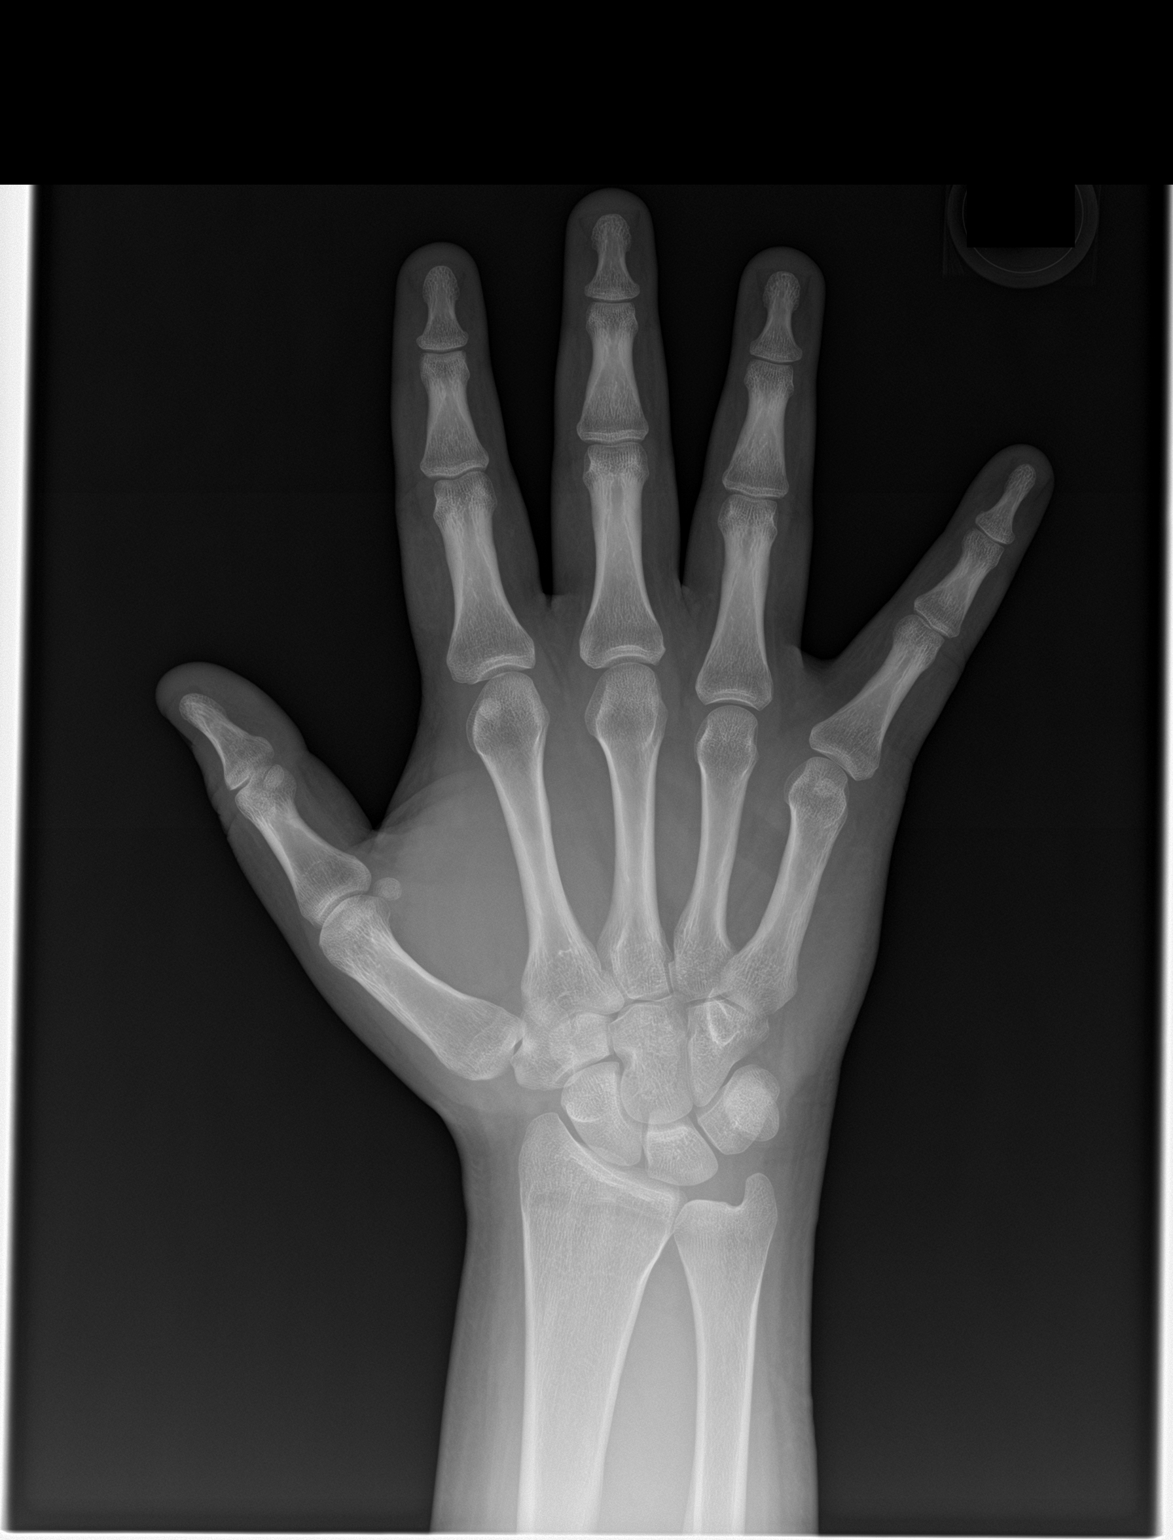

[hand obl]
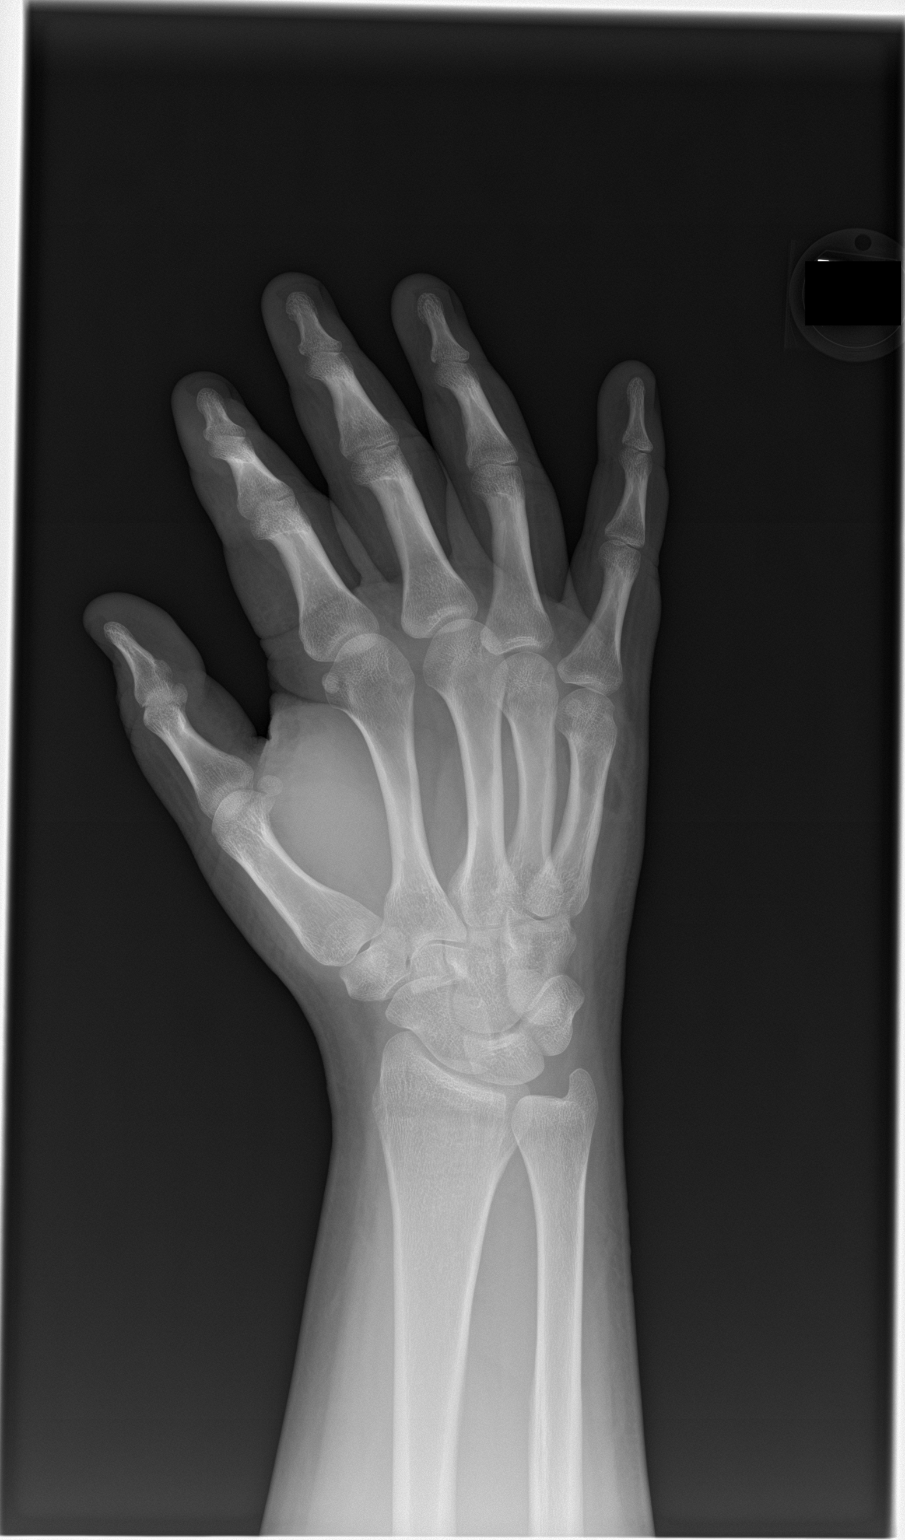

[hand lat]
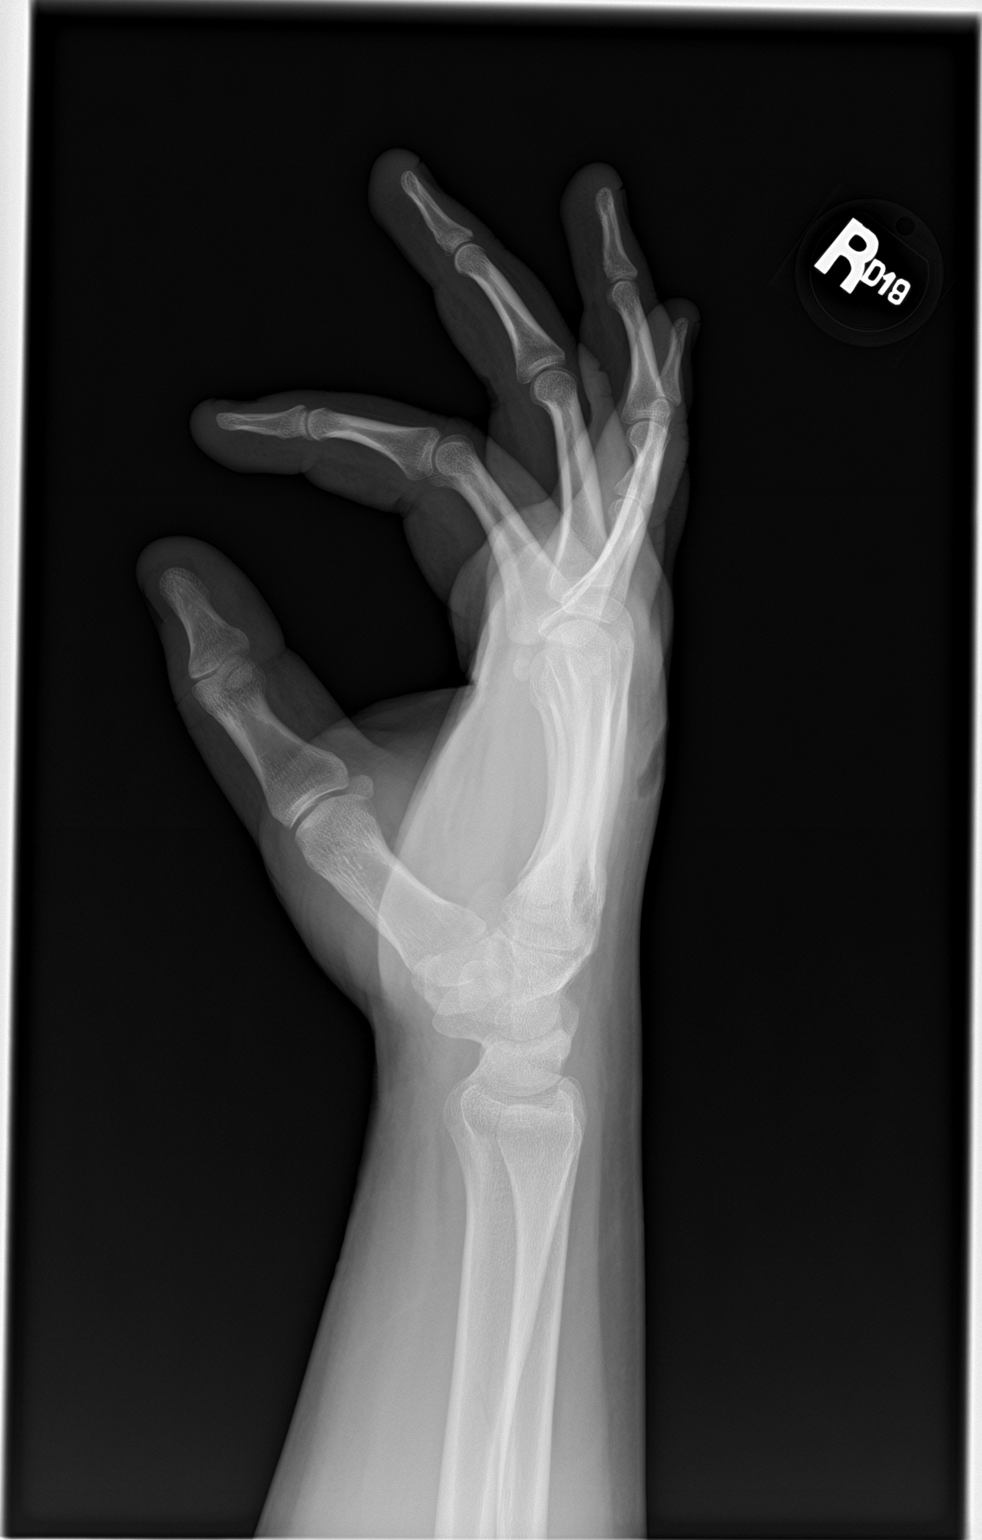

[3 of 3 positions shown; findings below may reference images not displayed]

FINDINGS: There is no acute fracture or dislocation. The bones are well
mineralized. No arthritic changes. Laceration of the soft tissues of
the dorsum of the hand. No radiopaque foreign object.
IMPRESSION: No acute fracture or dislocation.

## 2024-11-29 ENCOUNTER — Other Ambulatory Visit: Payer: Self-pay

## 2024-11-29 ENCOUNTER — Emergency Department (HOSPITAL_COMMUNITY)
Admission: EM | Admit: 2024-11-29 | Discharge: 2024-11-30 | Disposition: A | Payer: MEDICAID | Attending: Emergency Medicine | Admitting: Emergency Medicine

## 2024-11-29 ENCOUNTER — Emergency Department (HOSPITAL_COMMUNITY): Payer: MEDICAID

## 2024-11-29 ENCOUNTER — Encounter (HOSPITAL_COMMUNITY): Payer: Self-pay

## 2024-11-29 DIAGNOSIS — Z23 Encounter for immunization: Secondary | ICD-10-CM | POA: Insufficient documentation

## 2024-11-29 DIAGNOSIS — S61512A Laceration without foreign body of left wrist, initial encounter: Secondary | ICD-10-CM | POA: Diagnosis present

## 2024-11-29 DIAGNOSIS — F319 Bipolar disorder, unspecified: Secondary | ICD-10-CM | POA: Diagnosis present

## 2024-11-29 DIAGNOSIS — F840 Autistic disorder: Secondary | ICD-10-CM | POA: Diagnosis not present

## 2024-11-29 DIAGNOSIS — R45851 Suicidal ideations: Secondary | ICD-10-CM

## 2024-11-29 DIAGNOSIS — R7989 Other specified abnormal findings of blood chemistry: Secondary | ICD-10-CM | POA: Insufficient documentation

## 2024-11-29 DIAGNOSIS — R4689 Other symptoms and signs involving appearance and behavior: Secondary | ICD-10-CM | POA: Diagnosis present

## 2024-11-29 DIAGNOSIS — S0083XA Contusion of other part of head, initial encounter: Secondary | ICD-10-CM | POA: Diagnosis not present

## 2024-11-29 DIAGNOSIS — X781XXA Intentional self-harm by knife, initial encounter: Secondary | ICD-10-CM | POA: Insufficient documentation

## 2024-11-29 LAB — CBC
HCT: 46 % (ref 39.0–52.0)
Hemoglobin: 15.5 g/dL (ref 13.0–17.0)
MCH: 28.3 pg (ref 26.0–34.0)
MCHC: 33.7 g/dL (ref 30.0–36.0)
MCV: 83.9 fL (ref 80.0–100.0)
Platelets: 271 K/uL (ref 150–400)
RBC: 5.48 MIL/uL (ref 4.22–5.81)
RDW: 15.3 % (ref 11.5–15.5)
WBC: 5.6 K/uL (ref 4.0–10.5)
nRBC: 0 % (ref 0.0–0.2)

## 2024-11-29 LAB — URINE DRUG SCREEN
Amphetamines: NEGATIVE
Barbiturates: NEGATIVE
Benzodiazepines: NEGATIVE
Cocaine: NEGATIVE
Fentanyl: NEGATIVE
Methadone Scn, Ur: NEGATIVE
Opiates: NEGATIVE
Tetrahydrocannabinol: NEGATIVE

## 2024-11-29 LAB — BASIC METABOLIC PANEL WITH GFR
Anion gap: 8 (ref 5–15)
BUN: 14 mg/dL (ref 6–20)
CO2: 29 mmol/L (ref 22–32)
Calcium: 10.2 mg/dL (ref 8.9–10.3)
Chloride: 105 mmol/L (ref 98–111)
Creatinine, Ser: 1.32 mg/dL — ABNORMAL HIGH (ref 0.61–1.24)
GFR, Estimated: >60 mL/min
Glucose, Bld: 96 mg/dL (ref 70–99)
Potassium: 3.9 mmol/L (ref 3.5–5.1)
Sodium: 142 mmol/L (ref 135–145)

## 2024-11-29 LAB — ETHANOL: Alcohol, Ethyl (B): <15 mg/dL

## 2024-11-29 MED ORDER — LIDOCAINE-EPINEPHRINE (PF) 2 %-1:200000 IJ SOLN
10.0000 mL | Freq: Once | INTRAMUSCULAR | Status: AC
  Administered 2024-11-29: 10 mL
  Filled 2024-11-29: qty 20

## 2024-11-29 MED ORDER — TETANUS-DIPHTH-ACELL PERTUSSIS 5-2-15.5 LF-MCG/0.5 IM SUSP
0.5000 mL | Freq: Once | INTRAMUSCULAR | Status: AC
  Administered 2024-11-29: 0.5 mL via INTRAMUSCULAR
  Filled 2024-11-29: qty 0.5
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
## 2024-11-29 NOTE — ED Notes (Signed)
 ONE pt belonging bag moved to [employer] [address]. Same was labeled. Medic made aware.

## 2024-11-29 NOTE — Progress Notes (Signed)
 Inpatient Psychiatric Referral  Patient was recommended inpatient per Cathaleen Jacobson, NP. There are no available beds at [facility], per [facility]. Patient was referred to the following out of network facilities:  [facility] Provider Address Phone Fax  [facility]  [address] [place] [address] [phone]  [facility]  [address] [place] [address] [phone]  [facility]  [address] [place], [place] [address] [phone]  [facility]  [address] [place] [address] [phone]  [facility]  [address] [place] [address] [phone]  [facility]  [address], [place] [address] [phone]  [facility] EFAX  [address] [place], [place] [phone]  [facility]  [address] [place] [address] [phone]  [facility]  [address], [place] [address] [phone]  [facility]  [address] [place] [address] [phone]  [facility]  [address] [place] [address] [phone]    Situation ongoing, CSW to continue following and update chart as more information becomes available.   Harrie Sofia MSW, [place] 11/29/2024
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
## 2024-11-29 NOTE — Progress Notes (Signed)
 LCSW Progress Note  [id number]   Brian Cardenas  11/29/2024  3:24 PM  Description:   Inpatient Psychiatric Referral  Patient was recommended inpatient per Jadeka Mangrum  (NP). There are no available beds at [facility], per [facility] Carlo RN). Patient was referred to the following out of network facilities:    [facility] Provider Address Phone Fax  [facility]  [address], [place] [address] [phone]  [facility]  [address] [place] [address] [phone]  [facility]  [address] [place], [place] [address] [phone]  [facility]  [address] [place] [address] [phone]  [facility]  [address], [place] [address] [phone]  [facility]  [address] [place] [address] [phone]  [facility] EFAX  [address] [place], [place] [phone]  [facility]  [address], [place] [address] [phone]  [facility]  [address] [place] [address] [phone]      Situation ongoing, CSW to continue following and update chart as more information becomes available.      Brian Cardenas, MSW, LCSW  11/29/2024 3:24 PM
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
## 2024-11-29 NOTE — ED Notes (Signed)
Pt still sleeping...
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
## 2024-11-29 NOTE — ED Notes (Signed)
 Pt has one belonging bag placed in the cabinet behind 1-8 nursing station. Pt has been wanded by securtiy.

## 2024-11-30 DIAGNOSIS — F840 Autistic disorder: Secondary | ICD-10-CM

## 2024-11-30 DIAGNOSIS — F319 Bipolar disorder, unspecified: Secondary | ICD-10-CM

## 2024-11-30 MED ORDER — CLONIDINE HCL 0.1 MG PO TABS: 0.1000 mg | ORAL_TABLET | Freq: Two times a day (BID) | ORAL | 11 refills | Status: AC | PRN

## 2024-11-30 MED ORDER — HYDROCHLOROTHIAZIDE 25 MG PO TABS
25.0000 mg | ORAL_TABLET | Freq: Every day | ORAL | Status: DC
  Administered 2024-11-30: 25 mg via ORAL
  Filled 2024-11-30 (×2): qty 1

## 2024-11-30 MED ORDER — GUANFACINE HCL 1 MG PO TABS
2.0000 mg | ORAL_TABLET | Freq: Every day | ORAL | Status: DC
  Filled 2024-11-30: qty 2

## 2024-11-30 MED ORDER — FLUOXETINE HCL 10 MG PO CAPS
10.0000 mg | ORAL_CAPSULE | Freq: Every day | ORAL | Status: DC
  Administered 2024-11-30: 10 mg via ORAL
  Filled 2024-11-30: qty 1

## 2024-11-30 MED ORDER — PANTOPRAZOLE SODIUM 40 MG PO TBEC
40.0000 mg | DELAYED_RELEASE_TABLET | Freq: Every day | ORAL | Status: DC
  Administered 2024-11-30: 40 mg via ORAL
  Filled 2024-11-30: qty 1

## 2024-11-30 MED ORDER — ATORVASTATIN CALCIUM 10 MG PO TABS: 10.0000 mg | ORAL_TABLET | Freq: Every day | ORAL | Status: DC

## 2024-11-30 MED ORDER — LEVOTHYROXINE SODIUM 50 MCG PO TABS: 50.0000 ug | ORAL_TABLET | Freq: Every day | ORAL | Status: DC

## 2024-11-30 MED ORDER — DIVALPROEX SODIUM 250 MG PO DR TAB: 250.0000 mg | DELAYED_RELEASE_TABLET | Freq: Three times a day (TID) | ORAL | Status: DC

## 2024-11-30 MED ORDER — HYDROXYZINE HCL 10 MG PO TABS
10.0000 mg | ORAL_TABLET | Freq: Two times a day (BID) | ORAL | Status: DC
  Administered 2024-11-30: 10 mg via ORAL
  Filled 2024-11-30: qty 1

## 2024-11-30 MED ORDER — FENOFIBRATE 160 MG PO TABS
160.0000 mg | ORAL_TABLET | Freq: Every day | ORAL | Status: DC
  Filled 2024-11-30: qty 1

## 2024-11-30 MED ORDER — DIVALPROEX SODIUM 250 MG PO DR TAB
250.0000 mg | DELAYED_RELEASE_TABLET | Freq: Two times a day (BID) | ORAL | Status: DC
  Administered 2024-11-30: 250 mg via ORAL
  Filled 2024-11-30: qty 1

## 2024-11-30 MED ORDER — QUETIAPINE FUMARATE 100 MG PO TABS
200.0000 mg | ORAL_TABLET | Freq: Two times a day (BID) | ORAL | Status: DC
  Administered 2024-11-30: 200 mg via ORAL
  Filled 2024-11-30: qty 2

## 2024-11-30 NOTE — Discharge Instructions (Signed)
" °  Discharge recommendations:  Patient is to take medications as prescribed. Please see information for follow-up appointment with psychiatry and therapy. Please follow up with your primary care provider for all medical related needs.   Therapy: We recommend that patient participate in individual therapy to address mental health concerns.  Medications: The patient or guardian is to contact a medical professional and/or outpatient provider to address any new side effects that develop. The patient or guardian should update outpatient providers of any new medications and/or medication changes.   Atypical antipsychotics: If you are prescribed an atypical antipsychotic, it is recommended that your height, weight, BMI, blood pressure, fasting lipid panel, and fasting blood sugar be monitored by your outpatient providers.  Safety:  The patient should abstain from use of illicit substances/drugs and abuse of any medications. If symptoms worsen or do not continue to improve or if the patient becomes actively suicidal or homicidal then it is recommended that the patient return to the closest hospital emergency department, the [facility], or call 911 for further evaluation and treatment. National Suicide Prevention Lifeline 1-800-SUICIDE or (715)218-2438.  About 988 988 offers 24/7 access to trained crisis counselors who can help people experiencing mental health-related distress. People can call or text 988 or chat 988lifeline.org for themselves or if they are worried about a loved one who may need crisis support.  Crisis Mobile: [facility]:                     579-002-9952 (for crisis response 24 hours a day) [facility] Hotline:                                            (224)867-2212   Safety Plan Simcha Speir will reach out to his AFL caregiver Jerona Pepper, call 911 or call mobile crisis, or go to nearest emergency room if condition worsens  or if suicidal thoughts become active Patients' will follow up with [facility] for outpatient psychiatric services (therapy/medication management).  The suicide prevention education provided includes the following: Suicide risk factors Suicide prevention and interventions National Suicide Hotline telephone number [facility] assessment telephone number [facility] Emergency Assistance 911 [facility] and/or Residential Mobile Crisis Unit telephone number Request made of family/significant other to:  AFL caregiver Jerona Pepper Remove weapons (e.g., guns, rifles, knives), all items previously/currently identified as safety concern.   Remove drugs/medications (over the counter, prescriptions, illicit drugs), all items previously/currently identified as a safety concern.   "
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
